# Patient Record
Sex: Female | Born: 1970 | Race: Black or African American | Hispanic: No | Marital: Single | Smoking: Never smoker
Health system: Southern US, Community
[De-identification: ages and names within clinical notes are randomized; demographics above are authoritative.]

## PROBLEM LIST (undated history)

## (undated) HISTORY — PX: ECTOPIC PREGNANCY SURGERY: SHX613

---

## 2007-04-04 ENCOUNTER — Emergency Department: Payer: Self-pay | Admitting: Emergency Medicine

## 2007-08-26 ENCOUNTER — Other Ambulatory Visit: Payer: Self-pay

## 2007-08-26 ENCOUNTER — Inpatient Hospital Stay: Payer: Self-pay | Admitting: Psychiatry

## 2007-12-29 ENCOUNTER — Emergency Department: Payer: Self-pay | Admitting: Emergency Medicine

## 2008-12-15 ENCOUNTER — Emergency Department: Payer: Self-pay | Admitting: Emergency Medicine

## 2012-06-01 ENCOUNTER — Encounter (HOSPITAL_COMMUNITY): Payer: Self-pay | Admitting: *Deleted

## 2012-06-01 ENCOUNTER — Emergency Department (HOSPITAL_COMMUNITY)
Admission: EM | Admit: 2012-06-01 | Discharge: 2012-06-01 | Disposition: A | Discharge status: Home / Self Care | Payer: Self-pay | Attending: Emergency Medicine | Admitting: Emergency Medicine

## 2012-06-01 ENCOUNTER — Emergency Department (HOSPITAL_COMMUNITY): Payer: Self-pay

## 2012-06-01 DIAGNOSIS — N12 Tubulo-interstitial nephritis, not specified as acute or chronic: Secondary | ICD-10-CM | POA: Insufficient documentation

## 2012-06-01 DIAGNOSIS — E669 Obesity, unspecified: Secondary | ICD-10-CM | POA: Insufficient documentation

## 2012-06-01 DIAGNOSIS — N72 Inflammatory disease of cervix uteri: Secondary | ICD-10-CM | POA: Insufficient documentation

## 2012-06-01 DIAGNOSIS — R1032 Left lower quadrant pain: Secondary | ICD-10-CM | POA: Insufficient documentation

## 2012-06-01 LAB — CBC WITH DIFFERENTIAL/PLATELET
Basophils Absolute: 0 10*3/uL (ref 0.0–0.1)
Basophils Relative: 0 % (ref 0–1)
Eosinophils Absolute: 0.1 10*3/uL (ref 0.0–0.7)
Eosinophils Relative: 1 % (ref 0–5)
HCT: 36.1 % (ref 36.0–46.0)
Hemoglobin: 11.9 g/dL — ABNORMAL LOW (ref 12.0–15.0)
Lymphocytes Relative: 26 % (ref 12–46)
Lymphs Abs: 2 10*3/uL (ref 0.7–4.0)
MCH: 28.5 pg (ref 26.0–34.0)
MCHC: 33 g/dL (ref 30.0–36.0)
MCV: 86.4 fL (ref 78.0–100.0)
Monocytes Absolute: 0.3 10*3/uL (ref 0.1–1.0)
Monocytes Relative: 4 % (ref 3–12)
Neutro Abs: 5.4 10*3/uL (ref 1.7–7.7)
Neutrophils Relative %: 69 % (ref 43–77)
Platelets: 326 10*3/uL (ref 150–400)
RBC: 4.18 MIL/uL (ref 3.87–5.11)
RDW: 13.7 % (ref 11.5–15.5)
WBC: 7.8 10*3/uL (ref 4.0–10.5)

## 2012-06-01 LAB — URINALYSIS, ROUTINE W REFLEX MICROSCOPIC
Bilirubin Urine: NEGATIVE
Glucose, UA: NEGATIVE mg/dL
Hgb urine dipstick: NEGATIVE
Ketones, ur: 15 mg/dL — AB
Nitrite: NEGATIVE
Protein, ur: NEGATIVE mg/dL
Specific Gravity, Urine: 1.023 (ref 1.005–1.030)
Urobilinogen, UA: 1 mg/dL (ref 0.0–1.0)
pH: 8 (ref 5.0–8.0)

## 2012-06-01 LAB — BASIC METABOLIC PANEL
BUN: 12 mg/dL (ref 6–23)
CO2: 26 mEq/L (ref 19–32)
Calcium: 9 mg/dL (ref 8.4–10.5)
Chloride: 104 mEq/L (ref 96–112)
Creatinine, Ser: 0.87 mg/dL (ref 0.50–1.10)
GFR calc Af Amer: >90 mL/min (ref >90)
GFR calc non Af Amer: 81 mL/min — ABNORMAL LOW (ref >90)
Glucose, Bld: 132 mg/dL — ABNORMAL HIGH (ref 70–99)
Potassium: 4.4 mEq/L (ref 3.5–5.1)
Sodium: 137 mEq/L (ref 135–145)

## 2012-06-01 LAB — WET PREP, GENITAL
Clue Cells Wet Prep HPF POC: NONE SEEN
Trich, Wet Prep: NONE SEEN
Yeast Wet Prep HPF POC: NONE SEEN

## 2012-06-01 LAB — URINALYSIS, MICROSCOPIC ONLY
Glucose, UA: NEGATIVE mg/dL
pH: 6 (ref 5.0–8.0)

## 2012-06-01 LAB — URINE MICROSCOPIC-ADD ON

## 2012-06-01 LAB — PREGNANCY, URINE: Preg Test, Ur: NEGATIVE

## 2012-06-01 MED ORDER — HYDROMORPHONE HCL PF 1 MG/ML IJ SOLN
1.0000 mg | Freq: Once | INTRAMUSCULAR | Status: AC
  Administered 2012-06-01: 1 mg via INTRAVENOUS
  Filled 2012-06-01: qty 1

## 2012-06-01 MED ORDER — ONDANSETRON HCL 4 MG PO TABS: 4.0000 mg | ORAL_TABLET | Freq: Three times a day (TID) | ORAL | Status: AC | PRN

## 2012-06-01 MED ORDER — ONDANSETRON HCL 4 MG/2ML IJ SOLN
4.0000 mg | INTRAMUSCULAR | Status: AC
  Administered 2012-06-01: 4 mg via INTRAVENOUS
  Filled 2012-06-01: qty 2

## 2012-06-01 MED ORDER — AZITHROMYCIN 250 MG PO TABS
1000.0000 mg | ORAL_TABLET | Freq: Once | ORAL | Status: AC
  Administered 2012-06-01: 1000 mg via ORAL
  Filled 2012-06-01: qty 4

## 2012-06-01 MED ORDER — DEXTROSE 5 % IV SOLN
1.0000 g | INTRAVENOUS | Status: DC
  Administered 2012-06-01: 1 g via INTRAVENOUS
  Filled 2012-06-01: qty 10

## 2012-06-01 MED ORDER — LIDOCAINE HCL (PF) 1 % IJ SOLN
INTRAMUSCULAR | Status: AC
  Administered 2012-06-01: 19:00:00
  Filled 2012-06-01: qty 5

## 2012-06-01 MED ORDER — CEFTRIAXONE SODIUM 250 MG IJ SOLR
125.0000 mg | Freq: Once | INTRAMUSCULAR | Status: AC
  Administered 2012-06-01: 125 mg via INTRAMUSCULAR
  Filled 2012-06-01: qty 250

## 2012-06-01 MED ORDER — CEPHALEXIN 500 MG PO CAPS: 500.0000 mg | ORAL_CAPSULE | Freq: Four times a day (QID) | ORAL | Status: AC

## 2012-06-01 MED ORDER — OXYCODONE-ACETAMINOPHEN 5-325 MG PO TABS: ORAL_TABLET | ORAL | Status: AC

## 2012-06-01 MED ORDER — MORPHINE SULFATE 4 MG/ML IJ SOLN
4.0000 mg | Freq: Once | INTRAMUSCULAR | Status: AC
  Administered 2012-06-01: 4 mg via INTRAVENOUS
  Filled 2012-06-01: qty 1

## 2012-06-01 NOTE — ED Provider Notes (Signed)
History     CSN: [id number]  Arrival date & time 06/01/12  1448   None     Chief Complaint  Patient presents with  . Flank Pain    (Consider location/radiation/quality/duration/timing/severity/associated sxs/prior treatment) The history is provided by the patient and the spouse.    41 year old female who appears uncomfortable complaining of pain to the left lower quadrant with acute onset one hour ago. Pain is 10 out of 10, radiating to the groin, described as sharp. She states it feels similar to a prior episode of kidney stones. Patient vomited 1 time: it was nonbloody and nonbilious. No change in bowel or bladder habits. Patient denies fever. Patient has taken Advil approximately 600 mg 40 minutes ago with little relief. Patient was given 150 mcg of fentanyl and route by EMS pain was relieved for approximately 10 minutes. History of 1 kidney stone 3 years ago. She passed on her own with no intervention.  History reviewed. No pertinent past medical history.  History reviewed. No pertinent past surgical history.  History reviewed. No pertinent family history.  History  Substance Use Topics  . Smoking status: Never Smoker   . Smokeless tobacco: Not on file  . Alcohol Use: No    OB History    Grav Para Term Preterm Abortions TAB SAB Ect Mult Living                  Review of Systems  Constitutional: Negative for fever.  Gastrointestinal: Positive for nausea and vomiting. Negative for diarrhea and constipation.  Genitourinary: Positive for dysuria.  All other systems reviewed and are negative.    Allergies  Review of patient's allergies indicates no known allergies.  Home Medications   Current Outpatient Rx  Name Route Sig Dispense Refill  . IBUPROFEN 200 MG PO TABS Oral Take 600 mg by mouth every 6 (six) hours as needed. For pain      BP 135/82  Pulse 73  Temp 97.9 F (36.6 C) (Oral)  Resp 18  SpO2 96%  Physical Exam  Nursing note and vitals  reviewed. Constitutional: She appears well-developed and well-nourished. No distress.       Patient is obese and appears uncomfortable  HENT:  Head: Normocephalic.  Eyes: Conjunctivae and EOM are normal.  Cardiovascular: Normal rate and regular rhythm.   Pulmonary/Chest: Effort normal and breath sounds normal. No respiratory distress. She has no wheezes. She has no rales. She exhibits no tenderness.  Abdominal: Soft. She exhibits no distension and no mass. There is tenderness. There is no rebound and no guarding.       Tenderness to deep palpation of the left lower quadrant. No peritoneal signs.  Genitourinary: Vagina normal. Pelvic exam was performed with patient prone. There is no rash, tenderness, lesion or injury on the right labia. There is no rash, tenderness, lesion or injury on the left labia. Uterus is not tender. Cervix exhibits discharge. Cervix exhibits no motion tenderness and no friability. Right adnexum displays no mass, no tenderness and no fullness. Left adnexum displays no tenderness and no fullness. No erythema, tenderness or bleeding around the vagina. No foreign body around the vagina. No signs of injury around the vagina. No vaginal discharge found.       No CVA tenderness bilaterally. Tech present throughout pelvic exam. Thin white amine smelling vaginal discharge.   Musculoskeletal: Normal range of motion.  Neurological: She is alert.       Answers questions appropriately.  Skin: No  rash noted.  Psychiatric: She has a normal mood and affect.    ED Course  Procedures (including critical care time)  Labs Reviewed  URINALYSIS, ROUTINE W REFLEX MICROSCOPIC - Abnormal; Notable for the following:    APPearance HAZY (*)     Ketones, ur 15 (*)     Leukocytes, UA MODERATE (*)     All other components within normal limits  CBC WITH DIFFERENTIAL - Abnormal; Notable for the following:    Hemoglobin 11.9 (*)     All other components within normal limits  BASIC METABOLIC  PANEL - Abnormal; Notable for the following:    Glucose, Bld 132 (*)     GFR calc non Af Amer 81 (*)     All other components within normal limits  URINE MICROSCOPIC-ADD ON - Abnormal; Notable for the following:    Squamous Epithelial / LPF MANY (*)     Bacteria, UA FEW (*)     All other components within normal limits  WET PREP, GENITAL - Abnormal; Notable for the following:    WBC, Wet Prep HPF POC RARE (*)     All other components within normal limits  PREGNANCY, URINE  GC/CHLAMYDIA PROBE AMP, GENITAL  URINALYSIS, WITH MICROSCOPIC  URINE CULTURE   Ct Abdomen Pelvis Wo Contrast  06/01/2012  *RADIOLOGY REPORT*  Clinical Data: Left flank pain.  Nausea.  The kidney stone. Hematuria.  CT ABDOMEN AND PELVIS WITHOUT CONTRAST  Technique:  Multidetector CT imaging of the abdomen and pelvis was performed following the standard protocol without intravenous contrast.  Comparison: None.  Findings: Inflammatory changes are present involving the left kidney.  There is mild swelling of the kidney with perinephric stranding.  Mild dilation of the renal pelvis and right ureter however there is no calculus identified within the right ureter or urinary bladder.  This may relate to recent passage of stone or ascending urinary tract infection and pyelonephritis.  Clinically correlate with urinalysis.  There are no collecting system calculi identified.  The right kidney appears normal. Calcified phleboliths are present in the anatomic pelvis, including around the distal ureters bilaterally.  Otherwise the exam is within normal limits.  Physiologic appearance of the uterus and adnexa.  IMPRESSION: Inflammatory changes of the left kidney with ectasia of the right renal pelvis and right ureter.  This may relate to ascending urinary tract infection or recent passage of stone.  No calculi identified.  Original Report Authenticated By: Andreas Newport, M.D.     1. Pyelonephritis   2. Cervicitis       MDM  Although  triage note states his patient presents with left flank pain, she denies flank pain states her issues are in the left lower quadrant. Differential diagnosis kidney stone versus GYN origin. Doubt diverticulitis as bowel movements are normal.  0335 PM: After 4 mg of morphine patient relates pain is still 10 out of 10 I will give 1 mg of Dilaudid. UA in labs still pending  0415PM: After 1 Mg of hydromorphone pain is 7/10. UA shows no blood, contaminated sample. I will perform pelvic  0459PM: Pelvic performed and clinically within normal limits, I will order stone protocol CT to explain severe LLQ pain.   Wet prep shows WBC, I will treat cervicitis with rocephin IM and azithromycin PO.   CT shows perinephric stranding on the left side with no stones, I will obtain clean urine sample for culture and treat presumptively as pyelonephritis.   Discussed case with attending Dr. Ranae Palms  who agrees with care plan and stability to d/c to home.  Pt verbalized understanding and agrees with care plan. Outpatient follow-up and return precautions given.       Wynetta Emery, PA-C 06/01/12 1926

## 2012-06-01 NOTE — ED Notes (Signed)
 of fentanyl and # 20 to left hand per [facility]  PTA

## 2012-06-01 NOTE — ED Notes (Signed)
Left flank pain and wants to rule out kidney stone.

## 2012-06-01 NOTE — Discharge Instructions (Signed)
Return immediately for any worsening pain, uncontrolled, fever vomiting or any change in your bowel habits.   Pyelonephritis, Adult Pyelonephritis is a kidney infection. A kidney infection can happen quickly, or it can last for a long time. HOME CARE   Take your medicine (antibiotics) as told. Finish it even if you start to feel better.   Keep all doctor visits as told.   Drink enough fluids to keep your pee (urine) clear or pale yellow.   Only take medicine as told by your doctor.  GET HELP RIGHT AWAY IF:   You have a fever.   You cannot take your medicine or drink fluids as told.   You have chills and shaking.   You feel very weak or pass out (faint).   You do not feel better after 2 days.  MAKE SURE YOU:  Understand these instructions.   Will watch your condition.   Will get help right away if you are not doing well or get worse.  Document Released: 11/22/2004 Document Revised: 10/04/2011 Document Reviewed: 04/04/2011 [facility] Patient Information 2012 [place], [place].  RESOURCE GUIDE  Chronic Pain Problems: Contact [facility]  [phone] Patients need to be referred by their primary care doctor.  Insufficient Money for Medicine: Contact United Way:  call "211" or [facility] [phone].  No Primary Care Doctor: - Call [facility]  [phone] - can help you locate a primary care doctor that  accepts your insurance, provides certain services, etc. - Physician Referral [phone]  Agencies that provide inexpensive medical care: - [facility]  [phone] - [facility]  [phone] - [facility]  [phone] [facility]  [phone] - Planned Parenthood  [phone] [facility]  [phone]  Medicaid-accepting [facility] Providers: - [facility]- [address]  [phone], Mon-Fri 9am-7pm, Sat 9am-1pm - [facility]- [address] [place], [address] [place]  [phone] - [facility]- [address] [place]  [phone] [facility]- [address]  [phone] - Renaye Rakers- [address] [place], [address], [phone]  Only accepts [place] patients after they have their name  applied to their card  Self Pay (no insurance) in [place]: - Sickle Cell Patients: Dr Willey Blade, [facility]  [address] [place], [phone] - [facility]- [address] [place]  [phone]       [facility] [place]- [address]       -     [facility]- see information above (Speak to [employer] if you do not have insurance)       -  [facility]- [address] [place], [phone]       -  [facility]- [address],  [phone]       -  [facility]- [address] [phone]       -  Dr Julio Sicks-  [address], [address], [place], [phone]       -  [facility]- [address], [phone]       -  [facility]- [address], [phone], also [address], [phone]       -    [facility]- [phone]  [address], [phone], 1st & 3rd Saturday   every month, 10am-1pm  1) Find a Doctor and Pay Out of Pocket Although you won't have to find out who is covered by your insurance plan, it is a good idea to ask around and get recommendations. You will then need to call the office and see if the doctor you have chosen will accept you as a new patient and what types of options they offer for patients who are self-pay. Some doctors offer discounts or will set up payment plans for their patients who do not have insurance, but you will need to ask so you aren't surprised when you get to your appointment.  2) Contact Your Local Health Department Not all health departments have doctors that can see patients for sick visits, but many do, so it is worth a  call to see if yours does. If you don't know where your local health department is, you can check in your phone book. The CDC also has a tool to help you locate your state's health department, and many state websites also have listings of all of their local health departments.  3) Find a Walk-in Clinic If your illness is not likely to be very severe or complicated, you may want to try a walk in clinic. These are popping up all over the country in pharmacies, drugstores, and shopping centers. They're usually staffed by nurse practitioners or physician assistants that have been trained to treat common illnesses and complaints. They're usually fairly quick and inexpensive. However, if you have serious medical issues or chronic medical problems, these are probably not your best option  STD Testing - [facility] Department of [facility] [place], STD Clinic, [address] [place], phone [phone] or [phone].  Monday - Friday, call for an appointment. [facility] Department of [employer], STD Clinic, [address], [place], phone [phone] or [phone].  Monday - Friday, call for an appointment.  Abuse/Neglect: [facility] Child Abuse Hotline (209)757-0100 [facility] Child Abuse Hotline 301-104-7452 (After Hours)  Emergency Shelter:  [facility] 870-012-3262  Maternity Homes: - [facility] [place] [facility] [phone] - [facility] [phone]  MRSA Hotline #:   (418) 761-0339  [facility] Resources  [facility] [place]  United Way [facility] Dept. [address]                 [address]         [address] [place] [address]  Blondell Reveal Phone:  [phone]                                  Phone:  [phone]                   Phone:  [phone]  [facility],  [phone] - [facility] Services - [employer]-  ([phone]       -     [facility] in [place], [address],                                  [phone], [facility] Child Abuse Hotline 416-764-9530 or 980 333 3620 (After Hours)   Behavioral Health Services  Substance Abuse Resources: - [facility] Drug Services  [phone] - [facility] [phone] - [facility] [phone] Floydene Flock 7160489663 - Residential & Outpatient Substance Abuse Program  4695094168  Psychological Services: [facility]  [phone] [employer] [facility]  [phone] - [facility], [phone] [place]. [address] [place], ACCESS LINE: [phone] or 8043513091, [employer]  Dental Assistance  If unable to pay or uninsured, contact:  [facility] or [facility] to become qualified for the adult dental clinic.  Patients with Medicaid: [facility] [phone] [address], [phone] [address], [phone]  If unable to pay, or uninsured, contact [facility] [phone]) or [facility] Department [phone] in [place], [phone] in [facility]) to become qualified for the adult dental clinic  Other Low-Cost Community Dental Services: - [facility]- [address] [place], [place], [place], [address], [phone], Ext. [phone], 2nd and 4th Thursday of the month at 6:30am.  10 clients each day by appointment, can sometimes see walk-in patients if someone does not show for an appointment. [facility]- [address] [place], [place], [address], [phone] - [facility]- [address], [place], [place], [address], [phone] - [employer] [facility]- [phone] [facility] Health Department- [phone] [facility] Department- [phone]

## 2012-06-03 LAB — URINE CULTURE

## 2012-06-04 NOTE — ED Provider Notes (Signed)
Medical screening examination/treatment/procedure(s) were conducted as a shared visit with non-physician practitioner(s) and myself.  I personally evaluated the patient during the encounter   Monroe Qin, MD 06/04/12 2248 

## 2013-06-22 ENCOUNTER — Encounter (HOSPITAL_COMMUNITY): Payer: Self-pay | Admitting: Emergency Medicine

## 2013-06-22 ENCOUNTER — Emergency Department (HOSPITAL_COMMUNITY)
Admission: EM | Admit: 2013-06-22 | Discharge: 2013-06-22 | Disposition: A | Discharge status: Home / Self Care | Payer: Self-pay | Attending: Emergency Medicine | Admitting: Emergency Medicine

## 2013-06-22 ENCOUNTER — Emergency Department (HOSPITAL_COMMUNITY): Payer: Self-pay

## 2013-06-22 DIAGNOSIS — J069 Acute upper respiratory infection, unspecified: Secondary | ICD-10-CM | POA: Insufficient documentation

## 2013-06-22 DIAGNOSIS — J019 Acute sinusitis, unspecified: Secondary | ICD-10-CM | POA: Insufficient documentation

## 2013-06-22 MED ORDER — AZITHROMYCIN 250 MG PO TABS: 250.0000 mg | ORAL_TABLET | Freq: Every day | ORAL | Status: DC

## 2013-06-22 MED ORDER — BENZONATATE 100 MG PO CAPS: 100.0000 mg | ORAL_CAPSULE | Freq: Three times a day (TID) | ORAL | Status: DC

## 2013-06-22 NOTE — ED Notes (Signed)
Pt c/o pain with cough x 3 days; pt sts head congestion; pt sts sore throat and some chills

## 2013-06-22 NOTE — ED Provider Notes (Signed)
CSN: [id number]     Arrival date & time 06/22/13  4098 History     First MD Initiated Contact with Patient 06/22/13 [phone]     Chief Complaint  Patient presents with  . Cough  . URI  . Sore Throat   (Consider location/radiation/quality/duration/timing/severity/associated sxs/prior Treatment) HPI Comments: Patient presents with a chief complaint of cough, nasal congestion, and sinus pressure.  Symptoms have been present for the past 3 days and are gradually worsening.  She has not tried to take anything for her symptoms prior to arrival.  She denies SOB.  She reports that she was felt warm, but has not taken her temperature.  Temperature 98.2 orally upon arrival in the ED.  She does report that her throat feels scratchy and that she has also had some post nasal drip.  She does not smoke and never has.  No history of Asthma.  Patient is a 42 y.o. female presenting with cough, URI, and pharyngitis. The history is provided by the patient.  Cough Associated symptoms: rhinorrhea and sinus congestion   Associated symptoms: no chest pain, no chills, no ear pain, no fever, no rash, no shortness of breath and no wheezing   URI Presenting symptoms: congestion, cough and rhinorrhea   Presenting symptoms: no ear pain and no fever   Associated symptoms: no wheezing   Sore Throat Associated symptoms include congestion and coughing. Pertinent negatives include no chest pain, chills, fever or rash.    History reviewed. No pertinent past medical history. History reviewed. No pertinent past surgical history. History reviewed. No pertinent family history. History  Substance Use Topics  . Smoking status: Never Smoker   . Smokeless tobacco: Not on file  . Alcohol Use: No   OB History   Grav Para Term Preterm Abortions TAB SAB Ect Mult Living                 Review of Systems  Constitutional: Negative for fever and chills.  HENT: Positive for congestion, rhinorrhea, postnasal drip and sinus  pressure. Negative for ear pain.   Respiratory: Positive for cough. Negative for shortness of breath and wheezing.   Cardiovascular: Negative for chest pain.  Skin: Negative for rash.  All other systems reviewed and are negative.    Allergies  Review of patient's allergies indicates no known allergies.  Home Medications   Current Outpatient Rx  Name  Route  Sig  Dispense  Refill  . ibuprofen (ADVIL,MOTRIN) 200 MG tablet   Oral   Take 600 mg by mouth every 6 (six) hours as needed. For pain          BP 112/89  Pulse 81  Temp(Src) 98.2 F (36.8 C) (Oral)  Resp 22  SpO2 95% Physical Exam  Nursing note and vitals reviewed. Constitutional: She appears well-developed and well-nourished. No distress.  HENT:  Head: Normocephalic and atraumatic.  Right Ear: Tympanic membrane and ear canal normal.  Left Ear: Tympanic membrane and ear canal normal.  Nose: Mucosal edema and rhinorrhea present. Right sinus exhibits frontal sinus tenderness. Left sinus exhibits frontal sinus tenderness.  Mouth/Throat: Uvula is midline, oropharynx is clear and moist and mucous membranes are normal.  Cardiovascular: Normal rate, regular rhythm and normal heart sounds.   Pulmonary/Chest: Effort normal and breath sounds normal.  Neurological: She is alert.  Skin: Skin is warm and dry. She is not diaphoretic.  Psychiatric: She has a normal mood and affect.    ED Course   Procedures (including critical  care time)  Labs Reviewed  RAPID STREP SCREEN   Dg Chest 2 View  06/22/2013   *RADIOLOGY REPORT*  Clinical Data: Cough and congestion  CHEST - 2 VIEW  Comparison: None.  Findings: The heart and pulmonary vascularity are within normal limits.  The lungs are clear bilaterally.  No acute bony abnormality is noted.  IMPRESSION: No acute abnormalities seen.   Original Report Authenticated By: Alcide Clever, M.D.   No diagnosis found.  MDM  Pt CXR negative for acute infiltrate. Patients symptoms are  consistent with URI, likely viral etiology. Patient also with symptoms consistent with Acute Sinusitis.  Pt will be discharged with symptomatic treatment.  Verbalizes understanding and is agreeable with plan. Pt is hemodynamically stable & in NAD prior to dc.   Pascal Lux Bowdle, PA-C 06/22/13 1555

## 2013-06-24 LAB — CULTURE, GROUP A STREP

## 2013-06-24 NOTE — ED Provider Notes (Signed)
Medical screening examination/treatment/procedure(s) were performed by non-physician practitioner and as supervising physician I was immediately available for consultation/collaboration.   Laray Anger, DO 06/24/13 2105

## 2014-04-05 ENCOUNTER — Emergency Department: Payer: Self-pay | Admitting: Emergency Medicine

## 2014-08-26 ENCOUNTER — Emergency Department: Payer: Self-pay | Admitting: Student

## 2014-08-26 LAB — CBC
HCT: 35 % (ref 35.0–47.0)
HGB: 11.3 g/dL — AB (ref 12.0–16.0)
MCH: 28.5 pg (ref 26.0–34.0)
MCHC: 32.2 g/dL (ref 32.0–36.0)
MCV: 88 fL (ref 80–100)
PLATELETS: 340 10*3/uL (ref 150–440)
RBC: 3.96 10*6/uL (ref 3.80–5.20)
RDW: 14 % (ref 11.5–14.5)
WBC: 9 10*3/uL (ref 3.6–11.0)

## 2014-08-27 LAB — URINALYSIS, COMPLETE
Bacteria: NONE SEEN
Bilirubin,UR: NEGATIVE
GLUCOSE, UR: NEGATIVE mg/dL (ref 0–75)
Ketone: NEGATIVE
Leukocyte Esterase: NEGATIVE
Nitrite: NEGATIVE
Ph: 5 (ref 4.5–8.0)
Protein: NEGATIVE
SPECIFIC GRAVITY: 1.028 (ref 1.003–1.030)
Squamous Epithelial: <1

## 2014-08-27 LAB — WET PREP, GENITAL

## 2014-08-27 LAB — GC/CHLAMYDIA PROBE AMP

## 2014-12-16 ENCOUNTER — Emergency Department: Payer: Self-pay | Admitting: Emergency Medicine

## 2014-12-17 ENCOUNTER — Emergency Department: Payer: Self-pay | Admitting: Emergency Medicine

## 2014-12-20 ENCOUNTER — Emergency Department: Payer: Self-pay | Admitting: Emergency Medicine

## 2017-01-23 ENCOUNTER — Encounter: Payer: Self-pay | Admitting: Emergency Medicine

## 2017-01-23 DIAGNOSIS — B9689 Other specified bacterial agents as the cause of diseases classified elsewhere: Secondary | ICD-10-CM | POA: Insufficient documentation

## 2017-01-23 DIAGNOSIS — N760 Acute vaginitis: Secondary | ICD-10-CM | POA: Insufficient documentation

## 2017-01-23 DIAGNOSIS — M79604 Pain in right leg: Secondary | ICD-10-CM | POA: Insufficient documentation

## 2017-01-23 NOTE — ED Triage Notes (Signed)
Patient ambulatory to triage with steady gait, without difficulty or distress noted; pt reports pain to right LE with swelling; denies any known injury; also c/o intermittent pain to right side x week

## 2017-01-24 ENCOUNTER — Emergency Department: Payer: Self-pay

## 2017-01-24 ENCOUNTER — Emergency Department
Admission: EM | Admit: 2017-01-24 | Discharge: 2017-01-24 | Disposition: A | Discharge status: Home / Self Care | Payer: Self-pay | Attending: Emergency Medicine | Admitting: Emergency Medicine

## 2017-01-24 DIAGNOSIS — B9689 Other specified bacterial agents as the cause of diseases classified elsewhere: Secondary | ICD-10-CM

## 2017-01-24 DIAGNOSIS — N760 Acute vaginitis: Secondary | ICD-10-CM

## 2017-01-24 DIAGNOSIS — R52 Pain, unspecified: Secondary | ICD-10-CM

## 2017-01-24 DIAGNOSIS — R102 Pelvic and perineal pain: Secondary | ICD-10-CM

## 2017-01-24 DIAGNOSIS — M79604 Pain in right leg: Secondary | ICD-10-CM

## 2017-01-24 LAB — URINALYSIS, COMPLETE (UACMP) WITH MICROSCOPIC
BACTERIA UA: NONE SEEN
Bilirubin Urine: NEGATIVE
Glucose, UA: NEGATIVE mg/dL
Ketones, ur: NEGATIVE mg/dL
Leukocytes, UA: NEGATIVE
NITRITE: NEGATIVE
Protein, ur: NEGATIVE mg/dL
Specific Gravity, Urine: 1.026 (ref 1.005–1.030)
pH: 5 (ref 5.0–8.0)

## 2017-01-24 LAB — COMPREHENSIVE METABOLIC PANEL
ALBUMIN: 3.9 g/dL (ref 3.5–5.0)
ALK PHOS: 74 U/L (ref 38–126)
ALT: 15 U/L (ref 14–54)
AST: 23 U/L (ref 15–41)
Anion gap: 6 (ref 5–15)
BUN: 13 mg/dL (ref 6–20)
CALCIUM: 8.9 mg/dL (ref 8.9–10.3)
CHLORIDE: 105 mmol/L (ref 101–111)
CO2: 27 mmol/L (ref 22–32)
CREATININE: 0.75 mg/dL (ref 0.44–1.00)
GFR calc non Af Amer: >60 mL/min (ref >60)
GLUCOSE: 110 mg/dL — AB (ref 65–99)
Potassium: 3.7 mmol/L (ref 3.5–5.1)
SODIUM: 138 mmol/L (ref 135–145)
Total Bilirubin: 0.4 mg/dL (ref 0.3–1.2)
Total Protein: 8 g/dL (ref 6.5–8.1)

## 2017-01-24 LAB — CBC
HCT: 35.2 % (ref 35.0–47.0)
HEMOGLOBIN: 11.8 g/dL — AB (ref 12.0–16.0)
MCH: 28.5 pg (ref 26.0–34.0)
MCHC: 33.6 g/dL (ref 32.0–36.0)
MCV: 84.8 fL (ref 80.0–100.0)
PLATELETS: 334 10*3/uL (ref 150–440)
RBC: 4.15 MIL/uL (ref 3.80–5.20)
RDW: 14.5 % (ref 11.5–14.5)
WBC: 10.4 10*3/uL (ref 3.6–11.0)

## 2017-01-24 LAB — CHLAMYDIA/NGC RT PCR (ARMC ONLY)
CHLAMYDIA TR: NOT DETECTED
N gonorrhoeae: NOT DETECTED

## 2017-01-24 LAB — WET PREP, GENITAL
Sperm: NONE SEEN
TRICH WET PREP: NONE SEEN
YEAST WET PREP: NONE SEEN

## 2017-01-24 LAB — POCT PREGNANCY, URINE: Preg Test, Ur: NEGATIVE

## 2017-01-24 MED ORDER — METRONIDAZOLE 500 MG PO TABS: 500.0000 mg | ORAL_TABLET | Freq: Two times a day (BID) | ORAL | 0 refills | Status: DC

## 2017-01-24 NOTE — ED Provider Notes (Signed)
[facility] Emergency Department Provider Note   ____________________________________________   First MD Initiated Contact with Patient 01/24/17 0405     (approximate)  I have reviewed the triage vital signs and the nursing notes.   HISTORY  Chief Complaint Abdominal Pain and Leg Pain    HPI Heidi MeekerShawanna Acevedo is a 46 y.o. female who presents to the ED from home with a chief complaint of abdominal pain and leg pain. Patient reports right pelvic pain intermittently for the past month. Complains of right lateral leg pain with swelling times one day. Denies associated fever, chills, chest pain, shortness of breath, nausea, vomiting, dysuria, diarrhea. Denies recent travel or trauma. Nothing makes her symptoms better. Patient stands [facility] feet [facility] [employer] which makes her leg pain worse.   Past medical history None  There are no active problems to display for this patient.   History reviewed. No pertinent surgical history.  Prior to Admission medications   Medication Sig Start Date End Date Taking? Authorizing Provider  azithromycin (ZITHROMAX) 250 MG tablet Take 1 tablet (250 mg total) by mouth daily. Take first 2 tablets together, then 1 every day until finished. 06/22/13   Heather Laisure, PA-C  benzonatate (TESSALON) 100 MG capsule Take 1 capsule (100 mg total) by mouth every 8 (eight) hours. 06/22/13   Heather Laisure, PA-C  ibuprofen (ADVIL,MOTRIN) 200 MG tablet Take 600 mg by mouth every 6 (six) hours as needed. For pain    Historical Provider, MD    Allergies Patient has no known allergies.  No family history on file.  Social History Social History  Substance Use Topics  . Smoking status: Never Smoker  . Smokeless tobacco: Never Used  . Alcohol use No    Review of Systems  Constitutional: No fever/chills. Eyes: No visual changes. ENT: No sore throat. Cardiovascular: Denies chest pain. Respiratory: Denies shortness of  breath. Gastrointestinal: Positive for abdominal pain.  No nausea, no vomiting.  No diarrhea.  No constipation. Genitourinary: Negative for dysuria. Musculoskeletal: Positive for right leg pain. Negative for back pain. Skin: Negative for rash. Neurological: Negative for headaches, focal weakness or numbness.  10-point ROS otherwise negative.  ____________________________________________   PHYSICAL EXAM:  VITAL SIGNS: ED Triage Vitals [01/23/17 2356]  Enc Vitals Group     BP 126/85     Pulse Rate 81     Resp 18     Temp 98.2 F (36.8 C)     Temp Source Oral     SpO2 99 %     Weight 260 lb (117.9 kg)     Height 5\' 9"  (1.753 m)     Head Circumference      Peak Flow      Pain Score 8     Pain Loc      Pain Edu?      Excl. in GC?     Constitutional: Alert and oriented. Well appearing and in no acute distress. Eyes: Conjunctivae are normal. PERRL. EOMI. Head: Atraumatic. Nose: No congestion/rhinnorhea. Mouth/Throat: Mucous membranes are moist.  Oropharynx non-erythematous. Neck: No stridor.   Cardiovascular: Normal rate, regular rhythm. Grossly normal heart sounds.  Good peripheral circulation. Respiratory: Normal respiratory effort.  No retractions. Lungs CTAB. Gastrointestinal: Obese. Soft and mildly tender to palpation right pelvis without rebound or guarding. No distention. No abdominal bruits. No CVA tenderness. Musculoskeletal: Right lateral leg mildly tender to palpation. Calf is supple without evidence for compartment syndrome. 2+ distal pulses. Brisk, less than  5 second capillary refill. Symmetrically warm limbs without evidence for ischemia. 1+ pedal edema.  No joint effusions. Neurologic:  Normal speech and language. No gross focal neurologic deficits are appreciated.  Skin:  Skin is warm, dry and intact. No rash noted. Psychiatric: Mood and affect are normal. Speech and behavior are normal.  ____________________________________________   LABS (all labs ordered  are listed, but only abnormal results are displayed)  Labs Reviewed  WET PREP, GENITAL - Abnormal; Notable for the following:       Result Value   Clue Cells Wet Prep HPF POC PRESENT (*)    WBC, Wet Prep HPF POC FEW (*)    All other components within normal limits  CBC - Abnormal; Notable for the following:    Hemoglobin 11.8 (*)    All other components within normal limits  COMPREHENSIVE METABOLIC PANEL - Abnormal; Notable for the following:    Glucose, Bld 110 (*)    All other components within normal limits  URINALYSIS, COMPLETE (UACMP) WITH MICROSCOPIC - Abnormal; Notable for the following:    Color, Urine YELLOW (*)    APPearance HAZY (*)    Hgb urine dipstick SMALL (*)    Squamous Epithelial / LPF 6-30 (*)    All other components within normal limits  CHLAMYDIA/NGC RT PCR ([facility] ONLY)  POCT PREGNANCY, URINE   ____________________________________________  EKG  None ____________________________________________  RADIOLOGY  Right leg Doppler interpreted per Dr. Harrie Jeans: No evidence of deep venous thrombosis.  US pelvis interpreted per Dr. Manus Gunning: 1. Normal sonographic appearance of the ovaries with normal blood  flow. No torsion.  2. Minimal heterogeneity of myometrial echotexture similar to prior  exam, may be normal for this patient. Previous endometrial hyperemia  has resolved.    ____________________________________________   PROCEDURES  Procedure(s) performed:   Pelvic exam: External exam WNL without rashes, lesions or vesicles. Speculum exam reveals mild white discharge. Bimanual exam with mild right adnexal tenderness.  Procedures  Critical Care performed: No  ____________________________________________   INITIAL IMPRESSION / ASSESSMENT AND PLAN / ED COURSE  Pertinent labs & imaging results that were available during my care of the patient were reviewed by me and considered in my medical decision making (see chart for  details).  46 year old female who presents with a one-month history of right sided abdominal pain. Low suspicion clinically for acute appendicitis given duration of symptoms, no fever and normal white count. Will obtain pelvic ultrasound. Right leg pain most likely musculoskeletal as ultrasound is negative for DVT.  Clinical Course as of Jan 25 640  Thu Jan 24, 2017  1610 She resting in no acute distress. Updated her of wet prep and imaging results. Discharged home with a prescription for Flagyl and patient will follow-up with her GYN next week. Strict return precautions given. Patient verbalizes understanding and agrees with plan of care.  [JS]    Clinical Course User Index [JS] Irean Hong, MD     ____________________________________________   FINAL CLINICAL IMPRESSION(S) / ED DIAGNOSES  Final diagnoses:  Pain  Right leg pain  Pelvic pain  Bacterial vaginosis      NEW MEDICATIONS STARTED DURING THIS VISIT:  New Prescriptions   No medications on file     Note:  This document was prepared using Dragon voice recognition software and may include unintentional dictation errors.    Irean Hong, MD 01/24/17 [phone]

## 2017-01-24 NOTE — ED Notes (Signed)
Patient transported to Ultrasound at this time. 

## 2017-01-24 NOTE — Discharge Instructions (Signed)
1. Take antibiotic as prescribed (Flagyl 500 mg twice daily ×7 days). °2. Return to the ER for worsening symptoms, persistent vomiting, difficulty breathing or other concerns. °

## 2017-08-25 ENCOUNTER — Encounter: Payer: Self-pay | Admitting: *Deleted

## 2017-08-25 ENCOUNTER — Encounter: Payer: Self-pay | Admitting: Emergency Medicine

## 2017-08-25 ENCOUNTER — Emergency Department
Admission: EM | Admit: 2017-08-25 | Discharge: 2017-08-25 | Disposition: A | Discharge status: Home / Self Care | Payer: BLUE CROSS/BLUE SHIELD | Attending: Emergency Medicine | Admitting: Emergency Medicine

## 2017-08-25 DIAGNOSIS — R1033 Periumbilical pain: Secondary | ICD-10-CM | POA: Insufficient documentation

## 2017-08-25 DIAGNOSIS — Z79899 Other long term (current) drug therapy: Secondary | ICD-10-CM | POA: Diagnosis not present

## 2017-08-25 DIAGNOSIS — R350 Frequency of micturition: Secondary | ICD-10-CM | POA: Insufficient documentation

## 2017-08-25 DIAGNOSIS — R110 Nausea: Secondary | ICD-10-CM | POA: Insufficient documentation

## 2017-08-25 DIAGNOSIS — Z5321 Procedure and treatment not carried out due to patient leaving prior to being seen by health care provider: Secondary | ICD-10-CM | POA: Insufficient documentation

## 2017-08-25 LAB — CBC
HCT: 38.3 % (ref 35.0–47.0)
HEMATOCRIT: 36.2 % (ref 35.0–47.0)
HEMOGLOBIN: 12.4 g/dL (ref 12.0–16.0)
Hemoglobin: 12.5 g/dL (ref 12.0–16.0)
MCH: 28.3 pg (ref 26.0–34.0)
MCH: 29.4 pg (ref 26.0–34.0)
MCHC: 32.6 g/dL (ref 32.0–36.0)
MCHC: 34.1 g/dL (ref 32.0–36.0)
MCV: 86.3 fL (ref 80.0–100.0)
MCV: 86.6 fL (ref 80.0–100.0)
PLATELETS: 429 10*3/uL (ref 150–440)
Platelets: 404 10*3/uL (ref 150–440)
RBC: 4.2 MIL/uL (ref 3.80–5.20)
RBC: 4.43 MIL/uL (ref 3.80–5.20)
RDW: 14.3 % (ref 11.5–14.5)
RDW: 14.4 % (ref 11.5–14.5)
WBC: 7.4 10*3/uL (ref 3.6–11.0)
WBC: 8.2 10*3/uL (ref 3.6–11.0)

## 2017-08-25 LAB — URINALYSIS, COMPLETE (UACMP) WITH MICROSCOPIC
Bacteria, UA: NONE SEEN
Bilirubin Urine: NEGATIVE
Bilirubin Urine: NEGATIVE
GLUCOSE, UA: NEGATIVE mg/dL
Glucose, UA: NEGATIVE mg/dL
Hgb urine dipstick: NEGATIVE
KETONES UR: NEGATIVE mg/dL
KETONES UR: NEGATIVE mg/dL
NITRITE: NEGATIVE
Nitrite: NEGATIVE
PH: 6 (ref 5.0–8.0)
PROTEIN: NEGATIVE mg/dL
PROTEIN: NEGATIVE mg/dL
Specific Gravity, Urine: 1.026 (ref 1.005–1.030)
Specific Gravity, Urine: 1.026 (ref 1.005–1.030)
pH: 5 (ref 5.0–8.0)

## 2017-08-25 LAB — COMPREHENSIVE METABOLIC PANEL
ALT: 19 U/L (ref 14–54)
ANION GAP: 9 (ref 5–15)
AST: 25 U/L (ref 15–41)
Albumin: 3.8 g/dL (ref 3.5–5.0)
Alkaline Phosphatase: 72 U/L (ref 38–126)
BUN: 12 mg/dL (ref 6–20)
CHLORIDE: 103 mmol/L (ref 101–111)
CO2: 26 mmol/L (ref 22–32)
Calcium: 8.9 mg/dL (ref 8.9–10.3)
Creatinine, Ser: 0.75 mg/dL (ref 0.44–1.00)
GFR calc Af Amer: >60 mL/min (ref >60)
GFR calc non Af Amer: >60 mL/min (ref >60)
GLUCOSE: 135 mg/dL — AB (ref 65–99)
POTASSIUM: 4.1 mmol/L (ref 3.5–5.1)
SODIUM: 138 mmol/L (ref 135–145)
TOTAL PROTEIN: 7.8 g/dL (ref 6.5–8.1)
Total Bilirubin: 0.7 mg/dL (ref 0.3–1.2)

## 2017-08-25 LAB — POCT PREGNANCY, URINE
Preg Test, Ur: NEGATIVE
Preg Test, Ur: NEGATIVE

## 2017-08-25 LAB — LIPASE, BLOOD: LIPASE: 20 U/L (ref 11–51)

## 2017-08-25 NOTE — ED Triage Notes (Signed)
Patient with complaint of central abdominal pain and nausea that started last night. Patient states that she was here for the same last night and left before being seen.

## 2017-08-25 NOTE — ED Triage Notes (Signed)
Pt to ED reporting lower abd pain and tenderness around belly button with significant tenderness upon palpation. Pt reports nausea but denies vomiting or diarrhea. Pt denies fevers or decreased appetite. PT reports having increased urinary frequency but denies dysuria.

## 2017-08-26 ENCOUNTER — Encounter: Payer: Self-pay | Admitting: Radiology

## 2017-08-26 ENCOUNTER — Emergency Department
Admission: EM | Admit: 2017-08-26 | Discharge: 2017-08-26 | Disposition: A | Discharge status: Home / Self Care | Payer: BLUE CROSS/BLUE SHIELD | Attending: Emergency Medicine | Admitting: Emergency Medicine

## 2017-08-26 ENCOUNTER — Emergency Department: Payer: BLUE CROSS/BLUE SHIELD

## 2017-08-26 DIAGNOSIS — R109 Unspecified abdominal pain: Secondary | ICD-10-CM

## 2017-08-26 DIAGNOSIS — R1033 Periumbilical pain: Secondary | ICD-10-CM

## 2017-08-26 LAB — COMPREHENSIVE METABOLIC PANEL
ALT: 21 U/L (ref 14–54)
ANION GAP: 9 (ref 5–15)
AST: 26 U/L (ref 15–41)
Albumin: 4.2 g/dL (ref 3.5–5.0)
Alkaline Phosphatase: 73 U/L (ref 38–126)
BILIRUBIN TOTAL: 0.6 mg/dL (ref 0.3–1.2)
BUN: 14 mg/dL (ref 6–20)
CO2: 26 mmol/L (ref 22–32)
Calcium: 9.6 mg/dL (ref 8.9–10.3)
Chloride: 103 mmol/L (ref 101–111)
Creatinine, Ser: 0.91 mg/dL (ref 0.44–1.00)
GFR calc Af Amer: >60 mL/min (ref >60)
Glucose, Bld: 104 mg/dL — ABNORMAL HIGH (ref 65–99)
POTASSIUM: 3.8 mmol/L (ref 3.5–5.1)
Sodium: 138 mmol/L (ref 135–145)
Total Protein: 8.4 g/dL — ABNORMAL HIGH (ref 6.5–8.1)

## 2017-08-26 LAB — LIPASE, BLOOD: Lipase: 20 U/L (ref 11–51)

## 2017-08-26 MED ORDER — ETODOLAC 200 MG PO CAPS: 200.0000 mg | ORAL_CAPSULE | Freq: Three times a day (TID) | ORAL | 0 refills | Status: DC

## 2017-08-26 MED ORDER — KETOROLAC TROMETHAMINE 30 MG/ML IJ SOLN
30.0000 mg | Freq: Once | INTRAMUSCULAR | Status: AC
  Administered 2017-08-26: 30 mg via INTRAVENOUS
  Filled 2017-08-26: qty 1

## 2017-08-26 MED ORDER — IOPAMIDOL (ISOVUE-300) INJECTION 61%
125.0000 mL | Freq: Once | INTRAVENOUS | Status: AC | PRN
  Administered 2017-08-26: 125 mL via INTRAVENOUS

## 2017-08-26 NOTE — ED Notes (Signed)
Patient to stat desk asking about wait time. Patient given update on wait time. Patient states that if she is not seen by 2:00 she will leave. Patient in no acute distress at this time.

## 2017-08-26 NOTE — ED Provider Notes (Signed)
[facility] Emergency Department Provider Note   ____________________________________________   First MD Initiated Contact with Patient 08/26/17 0400     (approximate)  I have reviewed the triage vital signs and the nursing notes.   HISTORY  Chief Complaint Abdominal Pain    HPI Heidi Acevedo is a 46 y.o. female Who comes into the hospital today with pain coming from her belly button. She reports it is right in her belly button and it doesn't move anywhere else. The pain started last night. She reports that she had been doing some heavy lifting at work and not taking anything for the pain. The patient states her pain is 8 out of 10 in intensity. She's had some intermittent nausea with no vomiting diarrhea or constipation. She's never had pain this in the past. The patient states that she has no chest pain or shortness of breath. The patient was here last night but left prior to being seen. She is here for further evaluation of her umbilical pain.   History reviewed. No pertinent past medical history.  There are no active problems to display for this patient.   History reviewed. No pertinent surgical history.  Prior to Admission medications   Medication Sig Start Date End Date Taking? Authorizing Provider  azithromycin (ZITHROMAX) 250 MG tablet Take 1 tablet (250 mg total) by mouth daily. Take first 2 tablets together, then 1 every day until finished. 06/22/13   Santiago Glad, PA-C  benzonatate (TESSALON) 100 MG capsule Take 1 capsule (100 mg total) by mouth every 8 (eight) hours. 06/22/13   Santiago Glad, PA-C  etodolac (LODINE) 200 MG capsule Take 1 capsule (200 mg total) by mouth every 8 (eight) hours. 08/26/17   Rebecka Apley, MD  ibuprofen (ADVIL,MOTRIN) 200 MG tablet Take 600 mg by mouth every 6 (six) hours as needed. For pain    [provider]  metroNIDAZOLE (FLAGYL) 500 MG tablet Take 1 tablet (500 mg total) by mouth 2 (two)  times daily. 01/24/17   Irean Hong, MD    Allergies Patient has no known allergies.  No family history on file.  Social History Social History  Substance Use Topics  . Smoking status: Never Smoker  . Smokeless tobacco: Never Used  . Alcohol use No    Review of Systems  Constitutional: No fever/chills Eyes: No visual changes. ENT: No sore throat. Cardiovascular: Denies chest pain. Respiratory: Denies shortness of breath. Gastrointestinal: umbilical pain and  nausea, no vomiting.  No diarrhea.  No constipation. Genitourinary: Negative for dysuria. Musculoskeletal: Negative for back pain. Skin: Negative for rash. Neurological: Negative for headaches, focal weakness or numbness.  ____________________________________________   PHYSICAL EXAM:  VITAL SIGNS: ED Triage Vitals  Enc Vitals Group     BP 08/25/17 2321 127/81     Pulse Rate 08/25/17 2321 81     Resp 08/25/17 2321 18     Temp 08/25/17 2321 97.9 F (36.6 C)     Temp Source 08/25/17 2321 Oral     SpO2 08/25/17 2321 96 %     Weight 08/25/17 2321 250 lb (113.4 kg)     Height 08/25/17 2321 5\' 9"  (1.753 m)     Head Circumference --      Peak Flow --      Pain Score 08/25/17 2320 8     Pain Loc --      Pain Edu? --      Excl. in GC? --  Constitutional: Alert and oriented. Well appearing and in mild distress. Eyes: Conjunctivae are normal. PERRL. EOMI. Head: Atraumatic. Nose: No congestion/rhinnorhea. Mouth/Throat: Mucous membranes are moist.  Oropharynx non-erythematous. Cardiovascular: Normal rate, regular rhythm. Grossly normal heart sounds.  Good peripheral circulation. Respiratory: Normal respiratory effort.  No retractions. Lungs CTAB. Gastrointestinal: Soft with some tenderness in the umbilicus without redness, swelling or warmth to the area. No distention. positive bowel sounds Musculoskeletal: No lower extremity tenderness nor edema.   Neurologic:  Normal speech and language. No gross focal  neurologic deficits are appreciated. No gait instability. Skin:  Skin is warm, dry and intact.  Psychiatric: Mood and affect are normal.   ____________________________________________   LABS (all labs ordered are listed, but only abnormal results are displayed)  Labs Reviewed  COMPREHENSIVE METABOLIC PANEL - Abnormal; Notable for the following:       Result Value   Glucose, Bld 104 (*)    Total Protein 8.4 (*)    All other components within normal limits  URINALYSIS, COMPLETE (UACMP) WITH MICROSCOPIC - Abnormal; Notable for the following:    Color, Urine YELLOW (*)    APPearance HAZY (*)    Hgb urine dipstick SMALL (*)    Leukocytes, UA TRACE (*)    Squamous Epithelial / LPF 6-30 (*)    All other components within normal limits  LIPASE, BLOOD  CBC  POC URINE PREG, ED  POCT PREGNANCY, URINE   ____________________________________________  EKG  none ____________________________________________  RADIOLOGY  Ct Abdomen Pelvis W Contrast  Result Date: 08/26/2017 CLINICAL DATA:  Central abdominal pain, nausea for 2 days. EXAM: CT ABDOMEN AND PELVIS WITH CONTRAST TECHNIQUE: Multidetector CT imaging of the abdomen and pelvis was performed using the standard protocol following bolus administration of intravenous contrast. CONTRAST:  ISOVUE-300 IOPAMIDOL (ISOVUE-300) INJECTION 61% COMPARISON:  Pelvic ultrasound January 24, 2017 and CT abdomen and pelvis June 01, 2012 FINDINGS: LOWER CHEST: 5 mm ground-glass nodule lingula, no routine indicated follow-up. Included heart size is normal. No pericardial effusion. HEPATOBILIARY: Liver and gallbladder are normal. PANCREAS: Normal. SPLEEN: Normal. ADRENALS/URINARY TRACT: Kidneys are orthotopic, demonstrating symmetric enhancement. Punctate LEFT nephrolithiasis. No hydronephrosis or solid renal masses. The unopacified ureters are normal in course and caliber. Delayed imaging through the kidneys demonstrates symmetric prompt contrast excretion  within the proximal urinary collecting system. Urinary bladder is partially distended and unremarkable. Normal adrenal glands. STOMACH/BOWEL: Tiny hiatal hernia. The stomach, small and large bowel are normal in course and caliber without inflammatory changes, sensitivity decreased without oral contrast. Normal appendix. VASCULAR/LYMPHATIC: Aortoiliac vessels are normal in course and caliber. No lymphadenopathy by CT size criteria. Phleboliths in the pelvis. REPRODUCTIVE: Normal. OTHER: No intraperitoneal free fluid or free air. MUSCULOSKELETAL: Nonacute. Mild rectus abdominus diastases. Moderate sacroiliac osteoarthrosis. Moderate L5-S1 degenerative disc. Mild lower lumbar facet arthropathy. IMPRESSION: 1. No acute intra-abdominal or pelvic process. 2. Punctate nonobstructing LEFT nephrolithiasis. Electronically Signed   By: Awilda Metro M.D.   On: 08/26/2017 05:49    ____________________________________________   PROCEDURES  Procedure(s) performed: None  Procedures  Critical Care performed: No  ____________________________________________   INITIAL IMPRESSION / ASSESSMENT AND PLAN / ED COURSE  As part of my medical decision making, I reviewed the following data within the electronic MEDICAL RECORD NUMBER Notes from prior ED visits and [place] Controlled Substance Database   This is a 46 year old who comes into the hospital today with some pain around her belly button. The patient had some blood work which was unremarkable but given her  pain I did send the patient for a CT scan.  My differential diagnosis includes colitis, umbilical hernia, inflammation.  The patient's CT scan returned and it showed no acute intra-abdominal or pelvic process. The patient has some nonobstructing left nephrolithiasis but otherwise no other cause of her pain. I will give the patient a shot of Toradol and discharged home. Her white count is not elevated to do not feel that she needs any antibiotics for tonsillitis.  The patient to follow-up with the acute care clinic for further evaluation.      ____________________________________________   FINAL CLINICAL IMPRESSION(S) / ED DIAGNOSES  Final diagnoses:  Abdominal pain, unspecified abdominal location  Umbilical pain      NEW MEDICATIONS STARTED DURING THIS VISIT:  New Prescriptions   ETODOLAC (LODINE) 200 MG CAPSULE    Take 1 capsule (200 mg total) by mouth every 8 (eight) hours.     Note:  This document was prepared using Dragon voice recognition software and may include unintentional dictation errors.    Rebecka ApleyWebster, Allison P, MD 08/26/17 [phone]

## 2017-08-26 NOTE — Discharge Instructions (Signed)
Please follow up with your primary care physician. Please return with any redness, swelling, warmth or fever.

## 2018-01-10 ENCOUNTER — Emergency Department
Admission: EM | Admit: 2018-01-10 | Discharge: 2018-01-10 | Disposition: A | Discharge status: Home / Self Care | Payer: BLUE CROSS/BLUE SHIELD | Attending: Emergency Medicine | Admitting: Emergency Medicine

## 2018-01-10 ENCOUNTER — Other Ambulatory Visit: Payer: Self-pay

## 2018-01-10 ENCOUNTER — Encounter: Payer: Self-pay | Admitting: Emergency Medicine

## 2018-01-10 DIAGNOSIS — J111 Influenza due to unidentified influenza virus with other respiratory manifestations: Secondary | ICD-10-CM | POA: Diagnosis not present

## 2018-01-10 DIAGNOSIS — R69 Illness, unspecified: Secondary | ICD-10-CM

## 2018-01-10 DIAGNOSIS — Z79899 Other long term (current) drug therapy: Secondary | ICD-10-CM | POA: Diagnosis not present

## 2018-01-10 DIAGNOSIS — M7918 Myalgia, other site: Secondary | ICD-10-CM | POA: Diagnosis present

## 2018-01-10 LAB — INFLUENZA PANEL BY PCR (TYPE A & B)
Influenza A By PCR: NEGATIVE
Influenza B By PCR: NEGATIVE

## 2018-01-10 MED ORDER — PROMETHAZINE-DM 6.25-15 MG/5ML PO SYRP: 5.0000 mL | ORAL_SOLUTION | Freq: Four times a day (QID) | ORAL | 0 refills | Status: DC | PRN

## 2018-01-10 MED ORDER — IBUPROFEN 800 MG PO TABS: 800.0000 mg | ORAL_TABLET | Freq: Three times a day (TID) | ORAL | 0 refills | Status: DC | PRN

## 2018-01-10 MED ORDER — LIDOCAINE VISCOUS 2 % MT SOLN: 5.0000 mL | Freq: Four times a day (QID) | OROMUCOSAL | 0 refills | Status: DC | PRN

## 2018-01-10 NOTE — ED Triage Notes (Addendum)
Patient ambulatory to triage with steady gait, without difficulty or distress noted; mask placed; pt reports awoke this am with generalized body aches and drainage; denies sore throat but st "feels like I need to clear my throat"; denies any other accomp symptoms

## 2018-01-10 NOTE — ED Provider Notes (Signed)
[facility] Emergency Department Provider Note   ____________________________________________   First MD Initiated Contact with Patient 01/10/18 0730     (approximate)  I have reviewed the triage vital signs and the nursing notes.   HISTORY  Chief Complaint Generalized Body Aches    HPI Heidi Acevedo is a 47 y.o. female patient complain of waking up with generalized body aches.  Patient denies nasal congestion complain of postnasal drainage.  Patient denies nausea, vomiting, diarrhea.  Patient had taken flu shot for this season.  No palliative measures for complaint.  . History reviewed. No pertinent past medical history.  There are no active problems to display for this patient.   History reviewed. No pertinent surgical history.  Prior to Admission medications   Medication Sig Start Date End Date Taking? Authorizing Provider  azithromycin (ZITHROMAX) 250 MG tablet Take 1 tablet (250 mg total) by mouth daily. Take first 2 tablets together, then 1 every day until finished. 06/22/13   Santiago Glad, PA-C  benzonatate (TESSALON) 100 MG capsule Take 1 capsule (100 mg total) by mouth every 8 (eight) hours. 06/22/13   Santiago Glad, PA-C  etodolac (LODINE) 200 MG capsule Take 1 capsule (200 mg total) by mouth every 8 (eight) hours. 08/26/17   Rebecka Apley, MD  ibuprofen (ADVIL,MOTRIN) 200 MG tablet Take 600 mg by mouth every 6 (six) hours as needed. For pain    [provider]  ibuprofen (ADVIL,MOTRIN) 800 MG tablet Take 1 tablet (800 mg total) by mouth every 8 (eight) hours as needed for moderate pain. 01/10/18   Joni Reining, PA-C  lidocaine (XYLOCAINE) 2 % solution Use as directed 5 mLs in the mouth or throat every 6 (six) hours as needed for mouth pain. Mix with Phenergan DM for swish and swallow. 01/10/18   Joni Reining, PA-C  metroNIDAZOLE (FLAGYL) 500 MG tablet Take 1 tablet (500 mg total) by mouth 2 (two) times daily. 01/24/17    Irean Hong, MD  promethazine-dextromethorphan (PROMETHAZINE-DM) 6.25-15 MG/5ML syrup Take 5 mLs by mouth 4 (four) times daily as needed for cough. Mix with viscous lidocaine for swish and swallow. 01/10/18   Joni Reining, PA-C    Allergies Patient has no known allergies.  No family history on file.  Social History Social History   Tobacco Use  . Smoking status: Never Smoker  . Smokeless tobacco: Never Used  Substance Use Topics  . Alcohol use: No  . Drug use: No    Review of Systems Constitutional: No fever/chills.  Body aches Eyes: No visual changes. ENT: No sore throat. Cardiovascular: Denies chest pain. Respiratory: Denies shortness of breath. Gastrointestinal: No abdominal pain.  No nausea, no vomiting.  No diarrhea.  No constipation. Genitourinary: Negative for dysuria. Musculoskeletal: Negative for back pain. Skin: Negative for rash. Neurological: Negative for headaches, focal weakness or numbness.   ____________________________________________   PHYSICAL EXAM:  VITAL SIGNS: ED Triage Vitals  Enc Vitals Group     BP 01/10/18 0614 135/78     Pulse Rate 01/10/18 0614 80     Resp 01/10/18 0614 20     Temp 01/10/18 0614 98.3 F (36.8 C)     Temp Source 01/10/18 0614 Oral     SpO2 01/10/18 0614 100 %     Weight 01/10/18 0610 260 lb (117.9 kg)     Height 01/10/18 0610 5\' 9"  (1.753 m)     Head Circumference --      Peak Flow --  Pain Score 01/10/18 0609 6     Pain Loc --      Pain Edu? --      Excl. in GC? --    Constitutional: Alert and oriented. Well appearing and in no acute distress. Nose: Edematous nasal turbinates clear rhinorrhea.   Mouth/Throat: Mucous membranes are moist.  Oropharynx non-erythematous.  Postnasal drainage. Neck: No stridor.  Hematological/Lymphatic/Immunilogical: No cervical lymphadenopathy. Cardiovascular: Normal rate, regular rhythm. Grossly normal heart sounds.  Good peripheral circulation. Respiratory: Normal  respiratory effort.  No retractions. Lungs CTAB. Musculoskeletal: No lower extremity tenderness nor edema.  No joint effusions. Neurologic:  Normal speech and language. No gross focal neurologic deficits are appreciated. No gait instability. Skin:  Skin is warm, dry and intact. No rash noted. Psychiatric: Mood and affect are normal. Speech and behavior are normal.  ____________________________________________   LABS (all labs ordered are listed, but only abnormal results are displayed)  Labs Reviewed  INFLUENZA PANEL BY PCR (TYPE A & B)   ____________________________________________  EKG   ____________________________________________  RADIOLOGY  ED MD interpretation:    Official radiology report(s): No results found.  ____________________________________________   PROCEDURES  Procedure(s) performed: None  Procedures  Critical Care performed:   ____________________________________________   INITIAL IMPRESSION / ASSESSMENT AND PLAN / ED COURSE  As part of my medical decision making, I reviewed the following data within the electronic MEDICAL RECORD NUMBER    Viral illness.  Discussed negative flu results with patient.  Patient given discharge care instruction work note.  Patient advised take medication as directed and follow-up PCP if condition persists.      ____________________________________________   FINAL CLINICAL IMPRESSION(S) / ED DIAGNOSES  Final diagnoses:  Influenza-like illness     ED Discharge Orders        Ordered    promethazine-dextromethorphan (PROMETHAZINE-DM) 6.25-15 MG/5ML syrup  4 times daily PRN     01/10/18 0736    lidocaine (XYLOCAINE) 2 % solution  Every 6 hours PRN     01/10/18 0736    ibuprofen (ADVIL,MOTRIN) 800 MG tablet  Every 8 hours PRN     01/10/18 0736       Note:  This document was prepared using Dragon voice recognition software and may include unintentional dictation errors.    Joni ReiningSmith, Adit Riddles K, PA-C 01/10/18  [id number]    Jene EveryKinner, Robert, MD 01/10/18 [phone]

## 2018-01-10 NOTE — ED Notes (Signed)
See triage note  Presents with cough and body aches couple of days ago  Denies any sore throat but feels like she has something stuck in throat  States she woke up body aches about 4 am  Afebrile on arrival

## 2018-01-17 ENCOUNTER — Other Ambulatory Visit: Payer: Self-pay

## 2018-01-17 ENCOUNTER — Emergency Department: Payer: BLUE CROSS/BLUE SHIELD

## 2018-01-17 ENCOUNTER — Emergency Department
Admission: EM | Admit: 2018-01-17 | Discharge: 2018-01-17 | Disposition: A | Discharge status: Home / Self Care | Payer: BLUE CROSS/BLUE SHIELD | Attending: Emergency Medicine | Admitting: Emergency Medicine

## 2018-01-17 DIAGNOSIS — Z79899 Other long term (current) drug therapy: Secondary | ICD-10-CM | POA: Diagnosis not present

## 2018-01-17 DIAGNOSIS — J069 Acute upper respiratory infection, unspecified: Secondary | ICD-10-CM

## 2018-01-17 DIAGNOSIS — B9789 Other viral agents as the cause of diseases classified elsewhere: Secondary | ICD-10-CM

## 2018-01-17 DIAGNOSIS — R05 Cough: Secondary | ICD-10-CM | POA: Diagnosis present

## 2018-01-17 MED ORDER — HYDROCODONE-HOMATROPINE 5-1.5 MG/5ML PO SYRP
5.0000 mL | ORAL_SOLUTION | Freq: Four times a day (QID) | ORAL | 0 refills | Status: DC | PRN
Start: 2018-01-17 — End: 2022-11-28

## 2018-01-17 MED ORDER — ALBUTEROL SULFATE HFA 108 (90 BASE) MCG/ACT IN AERS: INHALATION_SPRAY | RESPIRATORY_TRACT | 1 refills | Status: DC

## 2018-01-17 NOTE — Discharge Instructions (Signed)

## 2018-01-17 NOTE — ED Provider Notes (Signed)
[facility] Emergency Department Provider Note  ____________________________________________   None    (approximate)  I have reviewed the triage vital signs and the nursing notes.   HISTORY  Chief Complaint Cough    HPI Heidi Acevedo is a 47 y.o. female who is generally healthy with no reported past or chronic medical history.  She presents for evaluation of persistent cough as well as sore throat and nasal congestion for about a week.  She was seen in this emergency department 1 week ago and determined to have a viral syndrome.  Influenza testing was negative.  She was provided with a cough medication, some lidocaine for her sore throat, and ibuprofen tablets for discomfort.  She reports that she feels a little bit better overall although she thinks that she had a mild fever 2 days ago.  However she continues to feel badly in general with a severe cough.  She states that she can feel things rattling in her chest when she coughs but nothing comes up.  It is worse with deep breaths or exertion and slightly better with rest.  She also reports severe nasal congestion.  She is concerned she may have pneumonia.  She denies chest pain except for the discomfort from her cough, shortness of breath except for feeling short of breath when she is coughing, abdominal pain, nausea, vomiting, and dysuria.  No past medical history on file.  There are no active problems to display for this patient.   No past surgical history on file.  Prior to Admission medications   Medication Sig Start Date End Date Taking? Authorizing Provider  albuterol (PROVENTIL HFA;VENTOLIN HFA) 108 (90 Base) MCG/ACT inhaler Inhale 2-4 puffs by mouth every 4 hours as needed for wheezing, cough, and/or shortness of breath 01/17/18   Loleta Rose, MD  azithromycin (ZITHROMAX) 250 MG tablet Take 1 tablet (250 mg total) by mouth daily. Take first 2 tablets together, then 1 every day until finished.  06/22/13   Santiago Glad, PA-C  benzonatate (TESSALON) 100 MG capsule Take 1 capsule (100 mg total) by mouth every 8 (eight) hours. 06/22/13   Santiago Glad, PA-C  etodolac (LODINE) 200 MG capsule Take 1 capsule (200 mg total) by mouth every 8 (eight) hours. 08/26/17   Rebecka Apley, MD  HYDROcodone-homatropine [facility]) 5-1.5 MG/5ML syrup Take 5 mLs by mouth every 6 (six) hours as needed for cough. 01/17/18   Loleta Rose, MD  ibuprofen (ADVIL,MOTRIN) 200 MG tablet Take 600 mg by mouth every 6 (six) hours as needed. For pain    [provider]  ibuprofen (ADVIL,MOTRIN) 800 MG tablet Take 1 tablet (800 mg total) by mouth every 8 (eight) hours as needed for moderate pain. 01/10/18   Joni Reining, PA-C  lidocaine (XYLOCAINE) 2 % solution Use as directed 5 mLs in the mouth or throat every 6 (six) hours as needed for mouth pain. Mix with Phenergan DM for swish and swallow. 01/10/18   Joni Reining, PA-C  metroNIDAZOLE (FLAGYL) 500 MG tablet Take 1 tablet (500 mg total) by mouth 2 (two) times daily. 01/24/17   Irean Hong, MD  promethazine-dextromethorphan (PROMETHAZINE-DM) 6.25-15 MG/5ML syrup Take 5 mLs by mouth 4 (four) times daily as needed for cough. Mix with viscous lidocaine for swish and swallow. 01/10/18   Joni Reining, PA-C    Allergies Patient has no known allergies.  No family history on file.  Social History Social History   Tobacco Use  . Smoking  status: Never Smoker  . Smokeless tobacco: Never Used  Substance Use Topics  . Alcohol use: No  . Drug use: No    Review of Systems Constitutional: Intermittent subjective fever over the last week, most notable 2 days ago Eyes: No visual changes. ENT: Nasal congestion, sore throat.  No ear pain. Cardiovascular: Denies chest pain except for some discomfort when she coughs Respiratory: Persistent severe cough, "rattling" but nonproductive.  Some difficulty breathing associated with the cough. Gastrointestinal:  No abdominal pain.  No nausea, no vomiting.  No diarrhea.  No constipation. Genitourinary: Negative for dysuria. Musculoskeletal: Negative for neck pain.  Negative for back pain. Integumentary: Negative for rash. Neurological: Negative for headaches, focal weakness or numbness.   ____________________________________________   PHYSICAL EXAM:  VITAL SIGNS: ED Triage Vitals [01/17/18 0221]  Enc Vitals Group     BP 126/64     Pulse Rate 83     Resp 20     Temp (!) 97.5 F (36.4 C)     Temp Source Oral     SpO2 95 %     Weight 117.9 kg (260 lb)     Height 1.753 m (5\' 9" )     Head Circumference      Peak Flow      Pain Score 8     Pain Loc      Pain Edu?      Excl. in GC?     Constitutional: Alert and oriented. Well appearing and in no acute distress. Eyes: Conjunctivae are normal.  Head: Atraumatic. Nose: +congestion/rhinnorhea. Mouth/Throat: Mucous membranes are moist.  Oropharynx is nonerythematous with no exudate and no petechiae.  No swelling or evidence of abscess Neck: No stridor.  No meningeal signs.  No cervical lymphadenopathy, no submandibular tenderness, induration, or lymphadenopathy Cardiovascular: Normal rate, regular rhythm. Good peripheral circulation. Grossly normal heart sounds. Respiratory: Frequent thick sounding cough, but normal respiratory effort.  No retractions. Lungs CTAB. Gastrointestinal: Soft and nontender. No distention.  Musculoskeletal: No lower extremity tenderness nor edema. No gross deformities of extremities. Neurologic:  Normal speech and language. No gross focal neurologic deficits are appreciated.  Skin:  Skin is warm, dry and intact. No rash noted. Psychiatric: Mood and affect are normal. Speech and behavior are normal.  ____________________________________________   LABS (all labs ordered are listed, but only abnormal results are displayed)  Labs Reviewed - No data to  display ____________________________________________  EKG  None - EKG not ordered by ED physician ____________________________________________  RADIOLOGY I, Loleta Rose, personally viewed and evaluated these images (plain radiographs) as part of my medical decision making, as well as reviewing the written report by the radiologist.  ED MD interpretation: No indication of infiltrate  Official radiology report(s): Dg Chest 2 View  Result Date: 01/17/2018 CLINICAL DATA:  Initial evaluation for acute cough, congestion. EXAM: CHEST - 2 VIEW COMPARISON:  Prior radiograph from 06/22/2013. FINDINGS: Cardiac and mediastinal silhouettes are stable in size and contour, and remain within normal limits. Lungs mildly hypoinflated with elevation left hemidiaphragm, stable. No focal infiltrates. No pulmonary edema or pleural effusion. No pneumothorax. No acute osseus abnormality. IMPRESSION: No active cardiopulmonary disease. Electronically Signed   By: Rise Mu M.D.   On: 01/17/2018 03:21    ____________________________________________   PROCEDURES  Critical Care performed: No   Procedure(s) performed:   Procedures   ____________________________________________   INITIAL IMPRESSION / ASSESSMENT AND PLAN / ED COURSE  As part of my medical decision making, I reviewed the following  data within the electronic MEDICAL RECORD NUMBER Nursing notes reviewed and incorporated, Radiograph reviewed  and Notes from prior ED visits    Differential diagnosis includes viral syndrome, persistent bronchitis, community-acquired pneumonia, less likely PE or aspirated foreign body or pertussis.  The patient does have a noticeable cough although it has no whooping quality and is not staccato in nature.  She is not having any difficulty breathing and her vital signs are stable.  Chest x-ray reassuring.  I counseled her regarding the anticipated duration of symptoms associated with a viral syndrome and I  encouraged her to try the medication she was Artie given.  I will also provided prescription for an albuterol inhaler as it might help with the bronchospasm and opening up her airways.  She understands and agrees with the plan.     ____________________________________________  FINAL CLINICAL IMPRESSION(S) / ED DIAGNOSES  Final diagnoses:  Viral URI with cough     MEDICATIONS GIVEN DURING THIS VISIT:  Medications - No data to display   ED Discharge Orders        Ordered    albuterol (PROVENTIL HFA;VENTOLIN HFA) 108 (90 Base) MCG/ACT inhaler     01/17/18 0507    HYDROcodone-homatropine (HYCODAN) 5-1.5 MG/5ML syrup  Every 6 hours PRN     01/17/18 0507       Note:  This document was prepared using Dragon voice recognition software and may include unintentional dictation errors.    Loleta RoseForbach, Eleanor Gatliff, MD 01/17/18 [phone]

## 2018-01-17 NOTE — ED Notes (Signed)

## 2018-01-17 NOTE — ED Triage Notes (Signed)
Pt in with co cough, and congestion. Chest soreness when she coughs and takes a deep breath. Was seen here last week for the same, and given cough medicine. Pt here for persistent cough. Pt wants to be checked for pneumonia, states had a fever 2 days ago.

## 2018-03-31 ENCOUNTER — Other Ambulatory Visit: Payer: Self-pay

## 2018-03-31 ENCOUNTER — Emergency Department
Admission: EM | Admit: 2018-03-31 | Discharge: 2018-03-31 | Disposition: A | Discharge status: Home / Self Care | Payer: BLUE CROSS/BLUE SHIELD | Attending: Emergency Medicine | Admitting: Emergency Medicine

## 2018-03-31 ENCOUNTER — Encounter: Payer: Self-pay | Admitting: Emergency Medicine

## 2018-03-31 DIAGNOSIS — K529 Noninfective gastroenteritis and colitis, unspecified: Secondary | ICD-10-CM | POA: Insufficient documentation

## 2018-03-31 DIAGNOSIS — R112 Nausea with vomiting, unspecified: Secondary | ICD-10-CM | POA: Diagnosis present

## 2018-03-31 LAB — COMPREHENSIVE METABOLIC PANEL
ALK PHOS: 73 U/L (ref 38–126)
ALT: 21 U/L (ref 14–54)
AST: 26 U/L (ref 15–41)
Albumin: 3.7 g/dL (ref 3.5–5.0)
Anion gap: 9 (ref 5–15)
BUN: 10 mg/dL (ref 6–20)
CALCIUM: 8.7 mg/dL — AB (ref 8.9–10.3)
CHLORIDE: 103 mmol/L (ref 101–111)
CO2: 23 mmol/L (ref 22–32)
CREATININE: 0.75 mg/dL (ref 0.44–1.00)
GFR calc non Af Amer: >60 mL/min (ref >60)
Glucose, Bld: 117 mg/dL — ABNORMAL HIGH (ref 65–99)
Potassium: 4 mmol/L (ref 3.5–5.1)
SODIUM: 135 mmol/L (ref 135–145)
Total Bilirubin: 0.7 mg/dL (ref 0.3–1.2)
Total Protein: 8.2 g/dL — ABNORMAL HIGH (ref 6.5–8.1)

## 2018-03-31 LAB — URINALYSIS, COMPLETE (UACMP) WITH MICROSCOPIC
Bacteria, UA: NONE SEEN
Bilirubin Urine: NEGATIVE
GLUCOSE, UA: NEGATIVE mg/dL
Hgb urine dipstick: NEGATIVE
KETONES UR: NEGATIVE mg/dL
Leukocytes, UA: NEGATIVE
Nitrite: NEGATIVE
PH: 6 (ref 5.0–8.0)
Protein, ur: NEGATIVE mg/dL
SPECIFIC GRAVITY, URINE: 1.023 (ref 1.005–1.030)

## 2018-03-31 LAB — CBC
HCT: 40 % (ref 35.0–47.0)
Hemoglobin: 13.3 g/dL (ref 12.0–16.0)
MCH: 28.5 pg (ref 26.0–34.0)
MCHC: 33.3 g/dL (ref 32.0–36.0)
MCV: 85.6 fL (ref 80.0–100.0)
PLATELETS: 314 10*3/uL (ref 150–440)
RBC: 4.67 MIL/uL (ref 3.80–5.20)
RDW: 14.5 % (ref 11.5–14.5)
WBC: 8.3 10*3/uL (ref 3.6–11.0)

## 2018-03-31 LAB — LIPASE, BLOOD: Lipase: 21 U/L (ref 11–51)

## 2018-03-31 MED ORDER — SODIUM CHLORIDE 0.9 % IV BOLUS
1000.0000 mL | Freq: Once | INTRAVENOUS | Status: AC
  Administered 2018-03-31: 1000 mL via INTRAVENOUS

## 2018-03-31 MED ORDER — GI COCKTAIL ~~LOC~~
30.0000 mL | Freq: Once | ORAL | Status: AC
  Administered 2018-03-31: 30 mL via ORAL
  Filled 2018-03-31: qty 30

## 2018-03-31 MED ORDER — ONDANSETRON HCL 4 MG PO TABS: 4.0000 mg | ORAL_TABLET | Freq: Every day | ORAL | 0 refills | Status: DC | PRN

## 2018-03-31 MED ORDER — METOCLOPRAMIDE HCL 5 MG/ML IJ SOLN
10.0000 mg | Freq: Once | INTRAMUSCULAR | Status: AC
  Administered 2018-03-31: 10 mg via INTRAVENOUS
  Filled 2018-03-31: qty 2

## 2018-03-31 MED ORDER — ONDANSETRON HCL 4 MG/2ML IJ SOLN
4.0000 mg | Freq: Once | INTRAMUSCULAR | Status: AC
  Administered 2018-03-31: 4 mg via INTRAVENOUS
  Filled 2018-03-31: qty 2

## 2018-03-31 NOTE — ED Notes (Signed)
Pt drinking water at this time to assess ability to tolerate PO intake following anti-emetic medication administered by previous RN, Paulette.

## 2018-03-31 NOTE — ED Triage Notes (Signed)
Pt to triage via w/c with no distress noted; pt reports N/V/D since this am

## 2018-03-31 NOTE — ED Provider Notes (Signed)
[facility] Emergency Department Provider Note  ____________________________________________   I have reviewed the triage vital signs and the nursing notes. Where available I have reviewed prior notes and, if possible and indicated, outside hospital notes.    HISTORY  Chief Complaint Emesis and Diarrhea    HPI Heidi Acevedo is a 47 y.o. female  Presents today complaining of nausea vomiting diarrhea since this morning.  Epigastric discomfort.  No fever no chills no melena no bright red blood per rectum innumerable vomiting and innumerable watery diarrhea episodes.  Fever chills positive sick contacts in the community with similar, very large to be needed burden of viral enteritis at this time with multiple patient presented to the ER with similar.  She denies pregnancy. Postmenopausal.  No antecedent symptoms, nothing makes it better nothing makes it worse, not vomiting at this time.  Feels that she is depleted.  Pain is minimal discomfort especially when vomiting.  No chest pain no shortness of breath radiation, no prior treatment  History reviewed. No pertinent past medical history.  There are no active problems to display for this patient.   History reviewed. No pertinent surgical history.  Prior to Admission medications   Medication Sig Start Date End Date Taking? Authorizing Provider  albuterol (PROVENTIL HFA;VENTOLIN HFA) 108 (90 Base) MCG/ACT inhaler Inhale 2-4 puffs by mouth every 4 hours as needed for wheezing, cough, and/or shortness of breath 01/17/18   Loleta RoseForbach, Cory, MD  azithromycin (ZITHROMAX) 250 MG tablet Take 1 tablet (250 mg total) by mouth daily. Take first 2 tablets together, then 1 every day until finished. 06/22/13   Santiago GladLaisure, Heather, PA-C  benzonatate (TESSALON) 100 MG capsule Take 1 capsule (100 mg total) by mouth every 8 (eight) hours. 06/22/13   Santiago GladLaisure, Heather, PA-C  etodolac (LODINE) 200 MG capsule Take 1 capsule (200 mg total) by  mouth every 8 (eight) hours. 08/26/17   Rebecka ApleyWebster, Allison P, MD  HYDROcodone-homatropine [facility](HYCODAN) 5-1.5 MG/5ML syrup Take 5 mLs by mouth every 6 (six) hours as needed for cough. 01/17/18   Loleta RoseForbach, Cory, MD  ibuprofen (ADVIL,MOTRIN) 200 MG tablet Take 600 mg by mouth every 6 (six) hours as needed. For pain    [provider]  ibuprofen (ADVIL,MOTRIN) 800 MG tablet Take 1 tablet (800 mg total) by mouth every 8 (eight) hours as needed for moderate pain. 01/10/18   Joni ReiningSmith, Ronald K, PA-C  lidocaine (XYLOCAINE) 2 % solution Use as directed 5 mLs in the mouth or throat every 6 (six) hours as needed for mouth pain. Mix with Phenergan DM for swish and swallow. 01/10/18   Joni ReiningSmith, Ronald K, PA-C  metroNIDAZOLE (FLAGYL) 500 MG tablet Take 1 tablet (500 mg total) by mouth 2 (two) times daily. 01/24/17   Irean HongSung, Jade J, MD  promethazine-dextromethorphan (PROMETHAZINE-DM) 6.25-15 MG/5ML syrup Take 5 mLs by mouth 4 (four) times daily as needed for cough. Mix with viscous lidocaine for swish and swallow. 01/10/18   Joni ReiningSmith, Ronald K, PA-C    Allergies Patient has no known allergies.  No family history on file.  Social History Social History   Tobacco Use  . Smoking status: Never Smoker  . Smokeless tobacco: Never Used  Substance Use Topics  . Alcohol use: No  . Drug use: No    Review of Systems Constitutional: No fever/chills Eyes: No visual changes. ENT: No sore throat. No stiff neck no neck pain Cardiovascular: Denies chest pain. Respiratory: Denies shortness of breath. Gastrointestinal:   no vomiting.  No diarrhea.  No constipation. Genitourinary: Negative for dysuria. Musculoskeletal: Negative lower extremity swelling Skin: Negative for rash. Neurological: Negative for severe headaches, focal weakness or numbness.   ____________________________________________   PHYSICAL EXAM:  VITAL SIGNS: ED Triage Vitals  Enc Vitals Group     BP 03/31/18 1929 134/83     Pulse Rate 03/31/18 1929  (!) 106     Resp 03/31/18 1929 18     Temp 03/31/18 1929 98.3 F (36.8 C)     Temp Source 03/31/18 1929 Oral     SpO2 03/31/18 1929 98 %     Weight 03/31/18 1928 260 lb (117.9 kg)     Height 03/31/18 1928 5\' 9"  (1.753 m)     Head Circumference --      Peak Flow --      Pain Score 03/31/18 1928 8     Pain Loc --      Pain Edu? --      Excl. in GC? --     Constitutional: Alert and oriented. Well appearing and in no acute distress. Eyes: Conjunctivae are normal Head: Atraumatic HEENT: No congestion/rhinnorhea. Mucous membranes are moist.  Oropharynx non-erythematous Neck:   Nontender with no meningismus, no masses, no stridor Cardiovascular: Normal rate, regular rhythm. Grossly normal heart sounds.  Good peripheral circulation. Respiratory: Normal respiratory effort.  No retractions. Lungs CTAB. Abdominal: Soft and slight epigastric discomfort noted. No distention. No guarding no rebound surgical abdomen obesity noted Back:  There is no focal tenderness or step off.  there is no midline tenderness there are no lesions noted. there is no CVA tenderness Musculoskeletal: No lower extremity tenderness, no upper extremity tenderness. No joint effusions, no DVT signs strong distal pulses no edema Neurologic:  Normal speech and language. No gross focal neurologic deficits are appreciated.  Skin:  Skin is warm, dry and intact. No rash noted. Psychiatric: Mood and affect are normal. Speech and behavior are normal.  ____________________________________________   LABS (all labs ordered are listed, but only abnormal results are displayed)  Labs Reviewed  COMPREHENSIVE METABOLIC PANEL - Abnormal; Notable for the following components:      Result Value   Glucose, Bld 117 (*)    Calcium 8.7 (*)    Total Protein 8.2 (*)    All other components within normal limits  URINALYSIS, COMPLETE (UACMP) WITH MICROSCOPIC - Abnormal; Notable for the following components:   Color, Urine YELLOW (*)     APPearance CLEAR (*)    All other components within normal limits  LIPASE, BLOOD  CBC  POC URINE PREG, ED    Pertinent labs  results that were available during my care of the patient were reviewed by me and considered in my medical decision making (see chart for details). ____________________________________________  EKG  I personally interpreted any EKGs ordered by me or triage  ____________________________________________  RADIOLOGY  Pertinent labs & imaging results that were available during my care of the patient were reviewed by me and considered in my medical decision making (see chart for details). If possible, patient and/or family made aware of any abnormal findings.  No results found. ____________________________________________    PROCEDURES  Procedure(s) performed: None  Procedures  Critical Care performed: None  ____________________________________________   INITIAL IMPRESSION / ASSESSMENT AND PLAN / ED COURSE  Pertinent labs & imaging results that were available during my care of the patient were reviewed by me and considered in my medical decision making (see chart for details).  Diarrhea, very well-appearing, will give  her IV fluid and Zofran, patient has had symptoms for approximately 16 hours, is my hope that we can her safely home.  Low suspicion for intra-abdominal pathology such as appendicitis gallbladder disease PID etc.    ____________________________________________   FINAL CLINICAL IMPRESSION(S) / ED DIAGNOSES  Final diagnoses:  None      This chart was dictated using voice recognition software.  Despite best efforts to proofread,  errors can occur which can change meaning.      Jeanmarie Plant, MD 03/31/18 2107

## 2018-03-31 NOTE — ED Notes (Signed)
Pt reports no c/o N/V following PO intake.

## 2018-03-31 NOTE — Discharge Instructions (Signed)
Return to the emergency room for any new or worrisome symptoms including vomiting, fever or abdominal pain, follow closely with primary care doctor take the medications as prescribed drink plenty of fluids.

## 2018-03-31 NOTE — ED Notes (Signed)
Pt states "I feel like crap." When asked to define what that means, pt states her pain has improved but that her nausea has not. No episodes of vomiting since she was roomed.

## 2018-03-31 NOTE — ED Notes (Signed)
ED Provider at bedside. 

## 2018-11-30 ENCOUNTER — Emergency Department
Admission: EM | Admit: 2018-11-30 | Discharge: 2018-11-30 | Disposition: A | Discharge status: Home / Self Care | Payer: BLUE CROSS/BLUE SHIELD | Attending: Student in an Organized Health Care Education/Training Program | Admitting: Student in an Organized Health Care Education/Training Program

## 2018-11-30 ENCOUNTER — Emergency Department: Payer: BLUE CROSS/BLUE SHIELD

## 2018-11-30 ENCOUNTER — Encounter: Payer: Self-pay | Admitting: Emergency Medicine

## 2018-11-30 DIAGNOSIS — R2243 Localized swelling, mass and lump, lower limb, bilateral: Secondary | ICD-10-CM | POA: Insufficient documentation

## 2018-11-30 DIAGNOSIS — M25571 Pain in right ankle and joints of right foot: Secondary | ICD-10-CM | POA: Insufficient documentation

## 2018-11-30 DIAGNOSIS — M7989 Other specified soft tissue disorders: Secondary | ICD-10-CM

## 2018-11-30 DIAGNOSIS — Z79899 Other long term (current) drug therapy: Secondary | ICD-10-CM | POA: Insufficient documentation

## 2018-11-30 LAB — BASIC METABOLIC PANEL
ANION GAP: 4 — AB (ref 5–15)
BUN: 11 mg/dL (ref 6–20)
CALCIUM: 8.9 mg/dL (ref 8.9–10.3)
CO2: 28 mmol/L (ref 22–32)
Chloride: 105 mmol/L (ref 98–111)
Creatinine, Ser: 0.67 mg/dL (ref 0.44–1.00)
Glucose, Bld: 116 mg/dL — ABNORMAL HIGH (ref 70–99)
Potassium: 4.5 mmol/L (ref 3.5–5.1)
SODIUM: 137 mmol/L (ref 135–145)

## 2018-11-30 LAB — URINALYSIS, COMPLETE (UACMP) WITH MICROSCOPIC
BACTERIA UA: NONE SEEN
Bilirubin Urine: NEGATIVE
Glucose, UA: NEGATIVE mg/dL
Hgb urine dipstick: NEGATIVE
KETONES UR: NEGATIVE mg/dL
Leukocytes, UA: NEGATIVE
Nitrite: NEGATIVE
PROTEIN: NEGATIVE mg/dL
Specific Gravity, Urine: 1.03 (ref 1.005–1.030)
pH: 5 (ref 5.0–8.0)

## 2018-11-30 MED ORDER — TRAMADOL HCL 50 MG PO TABS: 50.0000 mg | ORAL_TABLET | Freq: Four times a day (QID) | ORAL | 0 refills | Status: AC | PRN

## 2018-11-30 NOTE — ED Notes (Signed)
Pt transported to US

## 2018-11-30 NOTE — ED Notes (Signed)
Patient c/o bilateral ankle swelling X 2 days. Patient denies injury. Patient denies hx of PVD, CHF, and gout. Patient reports family hx of CHF. Patient denies orthopnea. No pitting edema seen on assessment of lower extremities.

## 2018-11-30 NOTE — ED Notes (Signed)
Patient refused ride in wheelchair to lobby during discharge

## 2018-11-30 NOTE — ED Triage Notes (Signed)
Patient with complaint of pain and swelling to bilateral ankles that started yesterday. Patient denies any injury.

## 2018-11-30 NOTE — ED Provider Notes (Signed)
[facility] Emergency Department Provider Note    First MD Initiated Contact with Patient 11/30/18 0700     (approximate)  I have reviewed the triage vital signs and the nursing notes.   HISTORY  Chief Complaint Foot Swelling    HPI Heidi Acevedo is a 48 y.o. female presents the ER for evaluation of bilateral lower extremity swelling as well as right ankle pain and discomfort particularly with walking.  No recent fevers.  Denies any shortness of breath orthopnea.  Does have family history of diabetes, hypertension and congestive heart failure.  Does also have family history of gout.  Denies any known trauma but has had worsening discomfort when walking.  History reviewed. No pertinent past medical history. No family history on file. Past Surgical History:  Procedure Laterality Date  . ECTOPIC PREGNANCY SURGERY     There are no active problems to display for this patient.     Prior to Admission medications   Medication Sig Start Date End Date Taking? Authorizing Provider  albuterol (PROVENTIL HFA;VENTOLIN HFA) 108 (90 Base) MCG/ACT inhaler Inhale 2-4 puffs by mouth every 4 hours as needed for wheezing, cough, and/or shortness of breath 01/17/18   Loleta Rose, MD  azithromycin (ZITHROMAX) 250 MG tablet Take 1 tablet (250 mg total) by mouth daily. Take first 2 tablets together, then 1 every day until finished. 06/22/13   Santiago Glad, PA-C  benzonatate (TESSALON) 100 MG capsule Take 1 capsule (100 mg total) by mouth every 8 (eight) hours. 06/22/13   Santiago Glad, PA-C  etodolac (LODINE) 200 MG capsule Take 1 capsule (200 mg total) by mouth every 8 (eight) hours. 08/26/17   Rebecka Apley, MD  HYDROcodone-homatropine [facility]) 5-1.5 MG/5ML syrup Take 5 mLs by mouth every 6 (six) hours as needed for cough. 01/17/18   Loleta Rose, MD  ibuprofen (ADVIL,MOTRIN) 200 MG tablet Take 600 mg by mouth every 6 (six) hours as needed. For pain    [provider]  ibuprofen (ADVIL,MOTRIN) 800 MG tablet Take 1 tablet (800 mg total) by mouth every 8 (eight) hours as needed for moderate pain. 01/10/18   Joni Reining, PA-C  lidocaine (XYLOCAINE) 2 % solution Use as directed 5 mLs in the mouth or throat every 6 (six) hours as needed for mouth pain. Mix with Phenergan DM for swish and swallow. 01/10/18   Joni Reining, PA-C  metroNIDAZOLE (FLAGYL) 500 MG tablet Take 1 tablet (500 mg total) by mouth 2 (two) times daily. 01/24/17   Irean Hong, MD  ondansetron (ZOFRAN) 4 MG tablet Take 1 tablet (4 mg total) by mouth daily as needed for nausea or vomiting. 03/31/18   Jeanmarie Plant, MD  promethazine-dextromethorphan (PROMETHAZINE-DM) 6.25-15 MG/5ML syrup Take 5 mLs by mouth 4 (four) times daily as needed for cough. Mix with viscous lidocaine for swish and swallow. 01/10/18   Joni Reining, PA-C  traMADol (ULTRAM) 50 MG tablet Take 1 tablet (50 mg total) by mouth every 6 (six) hours as needed. 11/30/18 11/30/19  Willy Eddy, MD    Allergies Patient has no known allergies.    Social History Social History   Tobacco Use  . Smoking status: Never Smoker  . Smokeless tobacco: Never Used  Substance Use Topics  . Alcohol use: No  . Drug use: No    Review of Systems Patient denies headaches, rhinorrhea, blurry vision, numbness, shortness of breath, chest pain, edema, cough, abdominal pain, nausea, vomiting, diarrhea, dysuria, fevers, rashes  or hallucinations unless otherwise stated above in HPI. ____________________________________________   PHYSICAL EXAM:  VITAL SIGNS: Vitals:   11/30/18 0019 11/30/18 0817  BP: 126/80 120/78  Pulse: 79 70  Resp: 18 16  Temp: 97.9 F (36.6 C)   SpO2: 99% 98%    Constitutional: Alert and oriented. Well appearing and in no acute distress. Eyes: Conjunctivae are normal.  Head: Atraumatic. Nose: No congestion/rhinnorhea. Mouth/Throat: Mucous membranes are moist.   Neck: Painless ROM.    Cardiovascular:   Good peripheral circulation. Respiratory: Normal respiratory effort.  No retractions.  Gastrointestinal: Soft and nontender.  Musculoskeletal: BLE non pitting edema and ttp of medial mal.  No joint effusions. Neurologic:  Normal speech and language. No gross focal neurologic deficits are appreciated.  Skin:  Skin is warm, dry and intact. No rash noted. Psychiatric: Mood and affect are normal. Speech and behavior are normal.  ____________________________________________   LABS (all labs ordered are listed, but only abnormal results are displayed)  Results for orders placed or performed during the hospital encounter of 11/30/18 (from the past 24 hour(s))  Urinalysis, Complete w Microscopic     Status: Abnormal   Collection Time: 11/30/18  8:14 AM  Result Value Ref Range   Color, Urine AMBER (A) YELLOW   APPearance CLOUDY (A) CLEAR   Specific Gravity, Urine 1.030 1.005 - 1.030   pH 5.0 5.0 - 8.0   Glucose, UA NEGATIVE NEGATIVE mg/dL   Hgb urine dipstick NEGATIVE NEGATIVE   Bilirubin Urine NEGATIVE NEGATIVE   Ketones, ur NEGATIVE NEGATIVE mg/dL   Protein, ur NEGATIVE NEGATIVE mg/dL   Nitrite NEGATIVE NEGATIVE   Leukocytes, UA NEGATIVE NEGATIVE   RBC / HPF 0-5 0 - 5 RBC/hpf   WBC, UA 0-5 0 - 5 WBC/hpf   Bacteria, UA NONE SEEN NONE SEEN   Squamous Epithelial / LPF 11-20 0 - 5   Mucus PRESENT   Basic metabolic panel     Status: Abnormal   Collection Time: 11/30/18  8:15 AM  Result Value Ref Range   Sodium 137 135 - 145 mmol/L   Potassium 4.5 3.5 - 5.1 mmol/L   Chloride 105 98 - 111 mmol/L   CO2 28 22 - 32 mmol/L   Glucose, Bld 116 (H) 70 - 99 mg/dL   BUN 11 6 - 20 mg/dL   Creatinine, Ser 4.090.67 0.44 - 1.00 mg/dL   Calcium 8.9 8.9 - 81.110.3 mg/dL   GFR calc non Af Amer >60 >60 mL/min   GFR calc Af Amer >60 >60 mL/min   Anion gap 4 (L) 5 - 15   ____________________________________________ _________________________________  RADIOLOGY  I personally reviewed  all radiographic images ordered to evaluate for the above acute complaints and reviewed radiology reports and findings.  These findings were personally discussed with the patient.  Please see medical record for radiology report.  ____________________________________________   PROCEDURES  Procedure(s) performed:  Procedures    Critical Care performed: no ____________________________________________   INITIAL IMPRESSION / ASSESSMENT AND PLAN / ED COURSE  Pertinent labs & imaging results that were available during my care of the patient were reviewed by me and considered in my medical decision making (see chart for details).  DDX: Edema, venous stasis, DVT, fracture, arthritis  Heidi Acevedo is a 48 y.o. who presents to the ED with symptoms as described above.  Exam is fairly reassuring.  Certainly no evidence of acute ischemia, infectious process, gout or other insidious process.  Blood work ultrasound and x-ray ordered for  the above differential was also reassuring.  This point believe she stable and appropriate for outpatient follow-up.      ____________________________________________   FINAL CLINICAL IMPRESSION(S) / ED DIAGNOSES  Final diagnoses:  Acute right ankle pain  Leg swelling      NEW MEDICATIONS STARTED DURING THIS VISIT:  New Prescriptions   TRAMADOL (ULTRAM) 50 MG TABLET    Take 1 tablet (50 mg total) by mouth every 6 (six) hours as needed.     Note:  This document was prepared using Dragon voice recognition software and may include unintentional dictation errors.     Willy Eddy, MD 11/30/18 [phone]

## 2019-04-24 ENCOUNTER — Other Ambulatory Visit: Payer: Self-pay | Admitting: Podiatry

## 2019-04-24 ENCOUNTER — Other Ambulatory Visit: Payer: Self-pay

## 2019-04-24 ENCOUNTER — Encounter: Payer: Self-pay | Admitting: Podiatry

## 2019-04-24 ENCOUNTER — Ambulatory Visit (INDEPENDENT_AMBULATORY_CARE_PROVIDER_SITE_OTHER): Payer: BC Managed Care – PPO

## 2019-04-24 ENCOUNTER — Ambulatory Visit: Payer: BC Managed Care – PPO | Admitting: Podiatry

## 2019-04-24 VITALS — BP 113/79 | HR 71 | Temp 97.9°F

## 2019-04-24 DIAGNOSIS — M659 Synovitis and tenosynovitis, unspecified: Secondary | ICD-10-CM

## 2019-04-24 DIAGNOSIS — M722 Plantar fascial fibromatosis: Secondary | ICD-10-CM

## 2019-04-24 DIAGNOSIS — M65979 Unspecified synovitis and tenosynovitis, unspecified ankle and foot: Secondary | ICD-10-CM

## 2019-04-24 MED ORDER — MELOXICAM 15 MG PO TABS: 15.0000 mg | ORAL_TABLET | Freq: Every day | ORAL | 1 refills | Status: DC

## 2019-04-24 MED ORDER — METHYLPREDNISOLONE 4 MG PO TBPK: ORAL_TABLET | ORAL | 0 refills | Status: DC

## 2019-04-27 NOTE — Progress Notes (Signed)
   Subjective:  48 year old female presenting today as a new patient with a chief complaint of bilateral ankle pain that began about two months ago. She also reports left plantar heel pain that began two weeks ago. She reports associated swelling and sharp pain in the ankles. She states the heel pain is constant. Standing for long periods of time increases the pain. She has not had any treatment for her symptoms. Patient is here for further evaluation and treatment.   History reviewed. No pertinent past medical history.   Objective/Physical Exam General: The patient is alert and oriented x3 in no acute distress.  Dermatology: Skin is warm, dry and supple bilateral lower extremities. Negative for open lesions or macerations.  Vascular: Palpable pedal pulses bilaterally. No edema or erythema noted. Capillary refill within normal limits.  Neurological: Epicritic and protective threshold grossly intact bilaterally.   Musculoskeletal Exam: Range of motion within normal limits to all pedal and ankle joints bilateral. Muscle strength 5/5 in all groups bilateral.  Upon weightbearing there is a medial longitudinal arch collapse bilaterally. Remove foot valgus noted to the bilateral lower extremities with excessive pronation upon mid stance. Tenderness to palpation to the plantar aspect of the left heel along the plantar fascia as well as to the anterior, lateral and medial aspects of the bilateral ankle joints.   Radiographic Exam:  Normal osseous mineralization. Joint spaces preserved. No fracture/dislocation/boney destruction.   Pes planus noted on radiographic exam lateral views. Decreased calcaneal inclination and metatarsal declination angle is noted. Anterior break in the cyma line noted on lateral views. Medial talar head to deviation noted on AP radiograph.   Assessment: 1. pes planus bilateral 2. Ankle synovitis bilateral  3. Plantar fasciitis left    Plan of Care:  1. Patient was  evaluated. X-Rays reviewed.  2. Injection of 0.5 mLs Celestone Soluspan injected into the bilateral ankle joints.  3. Injection of 0.5 mLs Celestone Soluspan injected into the left heel along the plantar fascia.  4. Prescription for Medrol Dose Pak provided to patient. 5. Prescription for Meloxicam provided to patient. 6. Compression anklets dispensed bilaterally.  7. Return to clinic in 4 weeks.   Goes by [employer]. 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 Currently on LOA taking care of mother.    Edrick Kins, DPM [facility]  Dr. Edrick Kins, [address]                                        [place], [place] [address]                Office [phone]  Fax [phone]

## 2019-04-28 ENCOUNTER — Other Ambulatory Visit: Payer: Self-pay

## 2019-04-28 MED ORDER — MELOXICAM 15 MG PO TABS: 15.0000 mg | ORAL_TABLET | Freq: Every day | ORAL | 1 refills | Status: DC

## 2019-04-28 MED ORDER — METHYLPREDNISOLONE 4 MG PO TBPK: ORAL_TABLET | ORAL | 0 refills | Status: DC

## 2019-04-28 NOTE — Progress Notes (Signed)
Rx Prednisone Pak and Meloxicam printed for patient pick up

## 2019-05-22 ENCOUNTER — Ambulatory Visit: Payer: BC Managed Care – PPO | Admitting: Podiatry

## 2020-06-30 DIAGNOSIS — M79676 Pain in unspecified toe(s): Secondary | ICD-10-CM

## 2020-07-22 ENCOUNTER — Other Ambulatory Visit: Payer: Self-pay | Admitting: Dentistry

## 2020-07-22 ENCOUNTER — Ambulatory Visit
Admission: RE | Admit: 2020-07-22 | Discharge: 2020-07-22 | Disposition: A | Discharge status: Home / Self Care | Payer: Disability Insurance | Source: Ambulatory Visit | Attending: Dentistry | Admitting: Dentistry

## 2020-07-22 ENCOUNTER — Ambulatory Visit
Admission: RE | Admit: 2020-07-22 | Discharge: 2020-07-22 | Disposition: A | Discharge status: Home / Self Care | Payer: Disability Insurance | Attending: Dentistry | Admitting: Dentistry

## 2020-07-22 DIAGNOSIS — R52 Pain, unspecified: Secondary | ICD-10-CM

## 2020-08-23 ENCOUNTER — Ambulatory Visit
Admission: RE | Admit: 2020-08-23 | Discharge: 2020-08-23 | Disposition: A | Discharge status: Home / Self Care | Payer: Disability Insurance | Source: Ambulatory Visit | Attending: Internal Medicine | Admitting: Internal Medicine

## 2020-08-23 ENCOUNTER — Ambulatory Visit
Admission: RE | Admit: 2020-08-23 | Discharge: 2020-08-23 | Disposition: A | Discharge status: Home / Self Care | Payer: Disability Insurance | Attending: Internal Medicine | Admitting: Internal Medicine

## 2020-08-23 ENCOUNTER — Other Ambulatory Visit: Payer: Self-pay | Admitting: Internal Medicine

## 2020-08-23 DIAGNOSIS — M1990 Unspecified osteoarthritis, unspecified site: Secondary | ICD-10-CM

## 2021-02-22 ENCOUNTER — Other Ambulatory Visit: Payer: Self-pay

## 2021-02-22 ENCOUNTER — Emergency Department
Admission: EM | Admit: 2021-02-22 | Discharge: 2021-02-22 | Disposition: A | Discharge status: Home / Self Care | Payer: BLUE CROSS/BLUE SHIELD | Attending: Student in an Organized Health Care Education/Training Program | Admitting: Student in an Organized Health Care Education/Training Program

## 2021-02-22 ENCOUNTER — Encounter: Payer: Self-pay | Admitting: Emergency Medicine

## 2021-02-22 ENCOUNTER — Emergency Department: Payer: BLUE CROSS/BLUE SHIELD

## 2021-02-22 DIAGNOSIS — M19071 Primary osteoarthritis, right ankle and foot: Secondary | ICD-10-CM | POA: Insufficient documentation

## 2021-02-22 MED ORDER — MELOXICAM 15 MG PO TABS: 15.0000 mg | ORAL_TABLET | Freq: Every day | ORAL | 0 refills | Status: AC

## 2021-02-22 MED ORDER — MELOXICAM 7.5 MG PO TABS
15.0000 mg | ORAL_TABLET | Freq: Once | ORAL | Status: AC
  Administered 2021-02-22: 15 mg via ORAL
  Filled 2021-02-22: qty 2

## 2021-02-22 NOTE — Discharge Instructions (Signed)
Please use the anti-inflammatory Mobic once daily as was sent to your pharmacy.  You may also use  Tylenol, up to 1000 mg 4 times daily.  Please follow-up with podiatry regarding your right foot and ankle pain.  Return to the emergency department for any worsening.

## 2021-02-22 NOTE — ED Provider Notes (Signed)
[facility] Emergency Department Provider Note  ____________________________________________   Event Date/Time   First MD Initiated Contact with Patient 02/22/21 1450     (approximate)  I have reviewed the triage vital signs and the nursing notes.   HISTORY  Chief Complaint Ankle Pain   HPI Kween Bacorn is a 50 y.o. female who presents to the emergency department for evaluation of right foot and ankle pain.  Patient states that she has had progressively worsening pain over the last month that has been insidious in onset, no acute traumas or falls to incite her pain.  She states that she has history of similar and was evaluated here and told that she has "edema".  She denies any pain swelling or redness into the calves, denies shortness of breath, orthopnea.  She denies history of blood clots, cardiac disease or diabetes.        History reviewed. No pertinent past medical history.  There are no problems to display for this patient.   Past Surgical History:  Procedure Laterality Date  . ECTOPIC PREGNANCY SURGERY      Prior to Admission medications   Medication Sig Start Date End Date Taking? Authorizing Provider  meloxicam (MOBIC) 15 MG tablet Take 1 tablet (15 mg total) by mouth daily for 15 days. 02/22/21 03/09/21 Yes Jyaire Koudelka, Ruben Gottron, PA  albuterol (PROVENTIL HFA;VENTOLIN HFA) 108 (90 Base) MCG/ACT inhaler Inhale 2-4 puffs by mouth every 4 hours as needed for wheezing, cough, and/or shortness of breath Patient not taking: Reported on 04/24/2019 01/17/18   Loleta Rose, MD  azithromycin (ZITHROMAX) 250 MG tablet Take 1 tablet (250 mg total) by mouth daily. Take first 2 tablets together, then 1 every day until finished. Patient not taking: Reported on 04/24/2019 06/22/13   Santiago Glad, PA-C  benzonatate (TESSALON) 100 MG capsule Take 1 capsule (100 mg total) by mouth every 8 (eight) hours. Patient not taking: Reported on 04/24/2019 06/22/13    Santiago Glad, PA-C  HYDROcodone-homatropine [facility]) 5-1.5 MG/5ML syrup Take 5 mLs by mouth every 6 (six) hours as needed for cough. Patient not taking: Reported on 04/24/2019 01/17/18   Loleta Rose, MD  ibuprofen (ADVIL,MOTRIN) 200 MG tablet Take 600 mg by mouth every 6 (six) hours as needed. For pain    [provider]  ibuprofen (ADVIL,MOTRIN) 800 MG tablet Take 1 tablet (800 mg total) by mouth every 8 (eight) hours as needed for moderate pain. Patient not taking: Reported on 04/24/2019 01/10/18   Joni Reining, PA-C  lidocaine (XYLOCAINE) 2 % solution Use as directed 5 mLs in the mouth or throat every 6 (six) hours as needed for mouth pain. Mix with Phenergan DM for swish and swallow. Patient not taking: Reported on 04/24/2019 01/10/18   Joni Reining, PA-C  methylPREDNISolone (MEDROL DOSEPAK) 4 MG TBPK tablet 6 day dose pack - take as directed 04/28/19   Felecia Shelling, DPM  metroNIDAZOLE (FLAGYL) 500 MG tablet Take 1 tablet (500 mg total) by mouth 2 (two) times daily. Patient not taking: Reported on 04/24/2019 01/24/17   Irean Hong, MD  ondansetron (ZOFRAN) 4 MG tablet Take 1 tablet (4 mg total) by mouth daily as needed for nausea or vomiting. Patient not taking: Reported on 04/24/2019 03/31/18   Jeanmarie Plant, MD  promethazine-dextromethorphan (PROMETHAZINE-DM) 6.25-15 MG/5ML syrup Take 5 mLs by mouth 4 (four) times daily as needed for cough. Mix with viscous lidocaine for swish and swallow. Patient not taking: Reported on 04/24/2019 01/10/18  Joni Reining, PA-C    Allergies Patient has no known allergies.  History reviewed. No pertinent family history.  Social History Social History   Tobacco Use  . Smoking status: Never Smoker  . Smokeless tobacco: Never Used  Substance Use Topics  . Alcohol use: No  . Drug use: No    Review of Systems Constitutional: No fever/chills Eyes: No visual changes. ENT: No sore throat. Cardiovascular: Denies chest  pain. Respiratory: Denies shortness of breath. Gastrointestinal: No abdominal pain.  No nausea, no vomiting.  No diarrhea.  No constipation. Genitourinary: Negative for dysuria. Musculoskeletal: + Right ankle pain, negative for back pain. Skin: Negative for rash. Neurological: Negative for headaches, focal weakness or numbness.   ____________________________________________   PHYSICAL EXAM:  VITAL SIGNS: ED Triage Vitals  Enc Vitals Group     BP 02/22/21 1412 (!) 139/119     Pulse Rate 02/22/21 1412 86     Resp 02/22/21 1412 20     Temp 02/22/21 1412 98.2 F (36.8 C)     Temp Source 02/22/21 1412 Oral     SpO2 02/22/21 1412 98 %     Weight 02/22/21 1411 280 lb (127 kg)     Height 02/22/21 1411 5\' 9"  (1.753 m)     Head Circumference --      Peak Flow --      Pain Score 02/22/21 1410 8     Pain Loc --      Pain Edu? --      Excl. in GC? --    Constitutional: Alert and oriented. Well appearing and in no acute distress. Eyes: Conjunctivae are normal. PERRL. EOMI. Head: Atraumatic. Nose: No congestion/rhinnorhea. Mouth/Throat: Mucous membranes are moist.  Oropharynx non-erythematous. Neck: No stridor.   Cardiovascular: Normal rate, regular rhythm. Grossly normal heart sounds.  Good peripheral circulation. Respiratory: Normal respiratory effort.  No retractions. Lungs CTAB. Musculoskeletal: There is soft tissue swelling present over the bilateral malleoli of the right ankle as well as anterior ankle joint line.  Swelling is similar on the contralateral side.  No pretibial pitting edema.  There is tenderness at the anterior joint line as well as into the midfoot.  Dorsal pedal pulses 2+, posterior tibial pulse 2+, capillary refill less than 3 seconds all digits.  Patient maintains appropriate range of motion of the right ankle but with increased pain at end dorsiflexion and plantarflexion.  No tenderness, increase in size or erythema to the right calf. Neurologic:  Normal speech  and language. No gross focal neurologic deficits are appreciated.  Gait not assessed secondary to right lower extremity pain. Skin:  Skin is warm, dry and intact. No rash noted. Psychiatric: Mood and affect are normal. Speech and behavior are normal.  ____________________________________________  RADIOLOGY I, 02/24/21, personally viewed and evaluated these images (plain radiographs) as part of my medical decision making, as well as reviewing the written report by the radiologist.  ED provider interpretation: X-ray of the right ankle reveals probable old fracture site of the medial malleolus, well-corticated and likely not new.  Patient also has diffuse degenerative changes noted in the midfoot, spurring at the calcaneus.  Official radiology report(s): DG Ankle Complete Right  Result Date: 02/22/2021 CLINICAL DATA:  Ankle pain, started 1 day ago pain on lateral and medial aspect of the ankle. No history of injury. No reported history of prior injury. EXAM: RIGHT ANKLE - COMPLETE 3+ VIEW COMPARISON:  Foot w exam April 24, 2019 FINDINGS: Generalized soft tissue  swelling, mild about the ankle. Ankle mortise is intact. Well corticated bone fragments are seen along the tip of the medial malleolus with unchanged appearance compared to prior imaging. No acute bone finding. Midfoot degenerative changes are noted similar to the prior study along with plantar and Achilles enthesopathy. Query small ankle effusion. IMPRESSION: 1. Mild soft tissue swelling without acute bony abnormality. Query small ankle effusion. 2. Signs of prior trauma to the medial malleolus, unchanged. Electronically Signed   By: Donzetta Kohut M.D.   On: 02/22/2021 15:02   ____________________________________________   INITIAL IMPRESSION / ASSESSMENT AND PLAN / ED COURSE  As part of my medical decision making, I reviewed the following data within the electronic MEDICAL RECORD NUMBER Nursing notes reviewed and incorporated,  Radiograph reviewed and Notes from prior ED visits        Patient is a 50 year old female who presents to the emergency department for evaluation of right ankle pain that is been progressively worsening over the last month.  She states that she works at [employer] and is regularly on her feet a lot.  See HPI for further details.  She was seen for similar complaint in June 2020 and x-ray today shows continued progression of degenerative changes.  She denies any cardiac history, shortness of breath, swelling or pain in the calf.  Physical exam shows tenderness along the anterior joint line of the ankle and midfoot with no significant decrease in range of motion.  She remains neurovascularly intact.  At this time, exam and findings most consistent with musculoskeletal pain from degenerative changes.  Upon chart review, she has seen podiatry before for similar complaint.  We will have her follow back up with Dr. Logan Bores regarding this.  In the interim, we will start her on anti-inflammatory and recommend Tylenol 4 times daily as well as provide temporary work note.  The patient is amenable with this plan.  She was advised to return to the emergency department if she experiences any erythema, swelling or pain in the calf, or shortness of breath.  Patient is amenable with plan, stable this time for outpatient follow-up.      ____________________________________________   FINAL CLINICAL IMPRESSION(S) / ED DIAGNOSES  Final diagnoses:  Arthritis of right ankle     ED Discharge Orders         Ordered    meloxicam (MOBIC) 15 MG tablet  Daily        02/22/21 1559          *Please note:  Garnet Chatmon was evaluated in Emergency Department on 02/22/2021 for the symptoms described in the history of present illness. She was evaluated in the context of the global COVID-19 pandemic, which necessitated consideration that the patient might be at risk for infection with the SARS-CoV-2 virus that causes COVID-19.  Institutional protocols and algorithms that pertain to the evaluation of patients at risk for COVID-19 are in a state of rapid change based on information released by regulatory bodies including the CDC and federal and state organizations. These policies and algorithms were followed during the patient's care in the ED.  Some ED evaluations and interventions may be delayed as a result of limited staffing during and the pandemic.*   Note:  This document was prepared using Dragon voice recognition software and may include unintentional dictation errors.   Lucy Chris, PA 02/22/21 1600    Willy Eddy, MD 02/22/21 [phone]

## 2021-02-22 NOTE — ED Triage Notes (Signed)
Pt to ED via POV with c/o R foot/ankle pain. Pt states long periods of standing at work. Pt states awoke after working last night with sharp pain to her R ankle, pain worse with standing at this time.

## 2021-05-18 ENCOUNTER — Encounter: Payer: Self-pay | Admitting: Emergency Medicine

## 2021-05-18 ENCOUNTER — Emergency Department
Admission: EM | Admit: 2021-05-18 | Discharge: 2021-05-18 | Disposition: A | Discharge status: Home / Self Care | Payer: BLUE CROSS/BLUE SHIELD | Attending: Emergency Medicine | Admitting: Emergency Medicine

## 2021-05-18 ENCOUNTER — Other Ambulatory Visit: Payer: Self-pay

## 2021-05-18 DIAGNOSIS — G5601 Carpal tunnel syndrome, right upper limb: Secondary | ICD-10-CM | POA: Insufficient documentation

## 2021-05-18 MED ORDER — MELOXICAM 7.5 MG PO TABS
15.0000 mg | ORAL_TABLET | Freq: Once | ORAL | Status: AC
  Administered 2021-05-18: 15 mg via ORAL
  Filled 2021-05-18: qty 2

## 2021-05-18 MED ORDER — MELOXICAM 15 MG PO TABS: 15.0000 mg | ORAL_TABLET | Freq: Every day | ORAL | 0 refills | Status: DC

## 2021-05-18 NOTE — ED Notes (Signed)
See triage note. Pt states numbness to middle and ring fingers beginning approx 1 month ago. Denies any specific MOI.

## 2021-05-18 NOTE — ED Provider Notes (Signed)
[facility] Emergency Department Provider Note  ____________________________________________  Time seen: Approximately 7:49 PM  I have reviewed the triage vital signs and the nursing notes.   HISTORY  Chief Complaint Numbness    HPI Heidi Acevedo is a 50 y.o. female who presents the emergency department complaining of numbness and tingling of the third and fourth fingers of the right hand.  Patient states that she works for [employer] doing the same repetitive motion over and over again.  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  She states that she has 2 parts the place and she does this roughly 800 times a night.  She is having numbness with no pain.  No injury to the hand or wrist.  Patient states that she thinks she may have carpal tunnel after talking to a family member who had same.  No medications prior to arrival.       History reviewed. No pertinent past medical history.  There are no problems to display for this patient.   Past Surgical History:  Procedure Laterality Date   ECTOPIC PREGNANCY SURGERY      Prior to Admission medications   Medication Sig Start Date End Date Taking? Authorizing Provider  meloxicam (MOBIC) 15 MG tablet Take 1 tablet (15 mg total) by mouth daily. 05/18/21  Yes Tayten Heber, Delorise Royals, PA-C  albuterol (PROVENTIL HFA;VENTOLIN HFA) 108 (90 Base) MCG/ACT inhaler Inhale 2-4 puffs by mouth every 4 hours as needed for wheezing, cough, and/or shortness of breath Patient not taking: Reported on 04/24/2019 01/17/18   Loleta Rose, MD  azithromycin (ZITHROMAX) 250 MG tablet Take 1 tablet (250 mg total) by mouth daily. Take first 2 tablets together, then 1 every day until finished. Patient not taking: Reported on 04/24/2019 06/22/13   Santiago Glad, PA-C  benzonatate (TESSALON) 100 MG capsule Take 1 capsule (100 mg total) by mouth every 8 (eight) hours. Patient not taking: Reported on 04/24/2019 06/22/13    Santiago Glad, PA-C  HYDROcodone-homatropine [facility]) 5-1.5 MG/5ML syrup Take 5 mLs by mouth every 6 (six) hours as needed for cough. Patient not taking: Reported on 04/24/2019 01/17/18   Loleta Rose, MD  ibuprofen (ADVIL,MOTRIN) 200 MG tablet Take 600 mg by mouth every 6 (six) hours as needed. For pain    [provider]  ibuprofen (ADVIL,MOTRIN) 800 MG tablet Take 1 tablet (800 mg total) by mouth every 8 (eight) hours as needed for moderate pain. Patient not taking: Reported on 04/24/2019 01/10/18   Joni Reining, PA-C  lidocaine (XYLOCAINE) 2 % solution Use as directed 5 mLs in the mouth or throat every 6 (six) hours as needed for mouth pain. Mix with Phenergan DM for swish and swallow. Patient not taking: Reported on 04/24/2019 01/10/18   Joni Reining, PA-C  methylPREDNISolone (MEDROL DOSEPAK) 4 MG TBPK tablet 6 day dose pack - take as directed 04/28/19   Felecia Shelling, DPM  metroNIDAZOLE (FLAGYL) 500 MG tablet Take 1 tablet (500 mg total) by mouth 2 (two) times daily. Patient not taking: Reported on 04/24/2019 01/24/17   Irean Hong, MD  ondansetron (ZOFRAN) 4 MG tablet Take 1 tablet (4 mg total) by mouth daily as needed for nausea or vomiting. Patient not taking: Reported on 04/24/2019 03/31/18   Jeanmarie Plant, MD  promethazine-dextromethorphan (PROMETHAZINE-DM) 6.25-15 MG/5ML syrup Take 5 mLs by mouth 4 (four) times daily as needed for cough. Mix with viscous lidocaine for swish and  swallow. Patient not taking: Reported on 04/24/2019 01/10/18   Joni Reining, PA-C    Allergies Patient has no known allergies.  No family history on file.  Social History Social History   Tobacco Use   Smoking status: Never   Smokeless tobacco: Never  Substance Use Topics   Alcohol use: No   Drug use: No     Review of Systems  Constitutional: No fever/chills Eyes: No visual changes. No discharge ENT: No upper respiratory complaints. Cardiovascular: no chest pain. Respiratory: no  cough. No SOB. Gastrointestinal: No abdominal pain.  No nausea, no vomiting.  No diarrhea.  No constipation. Musculoskeletal: Positive for numbness of the middle and ring finger to the right hand Skin: Negative for rash, abrasions, lacerations, ecchymosis. Neurological: Negative for headaches, focal weakness or numbness.  10 System ROS otherwise negative.  ____________________________________________   PHYSICAL EXAM:  VITAL SIGNS: ED Triage Vitals  Enc Vitals Group     BP 05/18/21 1748 (!) 171/99     Pulse Rate 05/18/21 1748 84     Resp 05/18/21 1748 18     Temp 05/18/21 1748 98.3 F (36.8 C)     Temp Source 05/18/21 1748 Oral     SpO2 05/18/21 1748 98 %     Weight 05/18/21 1755 279 lb 15.8 oz (127 kg)     Height 05/18/21 1755 5\' 9"  (1.753 m)     Head Circumference --      Peak Flow --      Pain Score 05/18/21 1755 0     Pain Loc --      Pain Edu? --      Excl. in GC? --      Constitutional: Alert and oriented. Well appearing and in no acute distress. Eyes: Conjunctivae are normal. PERRL. EOMI. Head: Atraumatic. ENT:      Ears:       Nose: No congestion/rhinnorhea.      Mouth/Throat: Mucous membranes are moist.  Neck: No stridor.    Cardiovascular: Normal rate, regular rhythm. Normal S1 and S2.  Good peripheral circulation. Respiratory: Normal respiratory effort without tachypnea or retractions. Lungs CTAB. Good air entry to the bases with no decreased or absent breath sounds. Musculoskeletal: Full range of motion to all extremities. No gross deformities appreciated.  Visualization of the right hand reveals no visible abnormality.  No erythema, edema, ecchymosis.  Full range of motion to the wrist and all digits right hand.  Patient has decreased sensation to the third and fourth digits but no loss of range of motion.  Capillary refill intact all digits. Neurologic:  Normal speech and language. No gross focal neurologic deficits are appreciated.  Skin:  Skin is warm, dry  and intact. No rash noted. Psychiatric: Mood and affect are normal. Speech and behavior are normal. Patient exhibits appropriate insight and judgement.   ____________________________________________   LABS (all labs ordered are listed, but only abnormal results are displayed)  Labs Reviewed - No data to display ____________________________________________  EKG   ____________________________________________  RADIOLOGY   No results found.  ____________________________________________    PROCEDURES  Procedure(s) performed:    Procedures    Medications  meloxicam (MOBIC) tablet 15 mg (has no administration in time range)     ____________________________________________   INITIAL IMPRESSION / ASSESSMENT AND PLAN / ED COURSE  Pertinent labs & imaging results that were available during my care of the patient were reviewed by me and considered in my medical decision making (see chart for  details).  Review of the [place] CSRS was performed in accordance of the [facility] prior to dispensing any controlled drugs.           Patient's diagnosis is consistent with carpal tunnel syndrome.  Patient presents with numbness of the middle and ring finger of the right hand.  She does repetitive motions at work every shift.  At this time I feel that symptoms are consistent with carpal tunnel given the repetitive nature of her work and the numbness in the distribution.  Patiently placed on anti-inflammatory and given a Velcro wrist brace.  Follow-up with Ortho if symptoms or not improving. Patient is given ED precautions to return to the ED for any worsening or new symptoms.     ____________________________________________  FINAL CLINICAL IMPRESSION(S) / ED DIAGNOSES  Final diagnoses:  Carpal tunnel syndrome of right wrist      NEW MEDICATIONS STARTED DURING THIS VISIT:  ED Discharge Orders          Ordered    meloxicam (MOBIC) 15 MG tablet  Daily        05/18/21 2012                 This chart was dictated using voice recognition software/Dragon. Despite best efforts to proofread, errors can occur which can change the meaning. Any change was purely unintentional.    Lanette Hampshire 05/18/21 2013    Concha Se, MD 05/19/21 [phone]

## 2021-05-18 NOTE — ED Triage Notes (Signed)
Pt reports for over a month now her right hand middle finger tip feels numb. Pt denies injuries or other sx's.

## 2021-07-20 ENCOUNTER — Emergency Department
Admission: EM | Admit: 2021-07-20 | Discharge: 2021-07-20 | Disposition: A | Discharge status: Home / Self Care | Payer: PRIVATE HEALTH INSURANCE | Attending: Emergency Medicine | Admitting: Emergency Medicine

## 2021-07-20 ENCOUNTER — Other Ambulatory Visit: Payer: Self-pay

## 2021-07-20 ENCOUNTER — Emergency Department: Payer: PRIVATE HEALTH INSURANCE

## 2021-07-20 ENCOUNTER — Encounter: Payer: Self-pay | Admitting: Emergency Medicine

## 2021-07-20 DIAGNOSIS — R0789 Other chest pain: Secondary | ICD-10-CM | POA: Insufficient documentation

## 2021-07-20 DIAGNOSIS — O0289 Other abnormal products of conception: Secondary | ICD-10-CM | POA: Insufficient documentation

## 2021-07-20 DIAGNOSIS — R109 Unspecified abdominal pain: Secondary | ICD-10-CM | POA: Insufficient documentation

## 2021-07-20 DIAGNOSIS — R110 Nausea: Secondary | ICD-10-CM | POA: Insufficient documentation

## 2021-07-20 LAB — BASIC METABOLIC PANEL
Anion gap: 6 (ref 5–15)
BUN: 15 mg/dL (ref 6–20)
CO2: 25 mmol/L (ref 22–32)
Calcium: 9 mg/dL (ref 8.9–10.3)
Chloride: 103 mmol/L (ref 98–111)
Creatinine, Ser: 0.85 mg/dL (ref 0.44–1.00)
GFR, Estimated: >60 mL/min (ref >60)
Glucose, Bld: 156 mg/dL — ABNORMAL HIGH (ref 70–99)
Potassium: 4.3 mmol/L (ref 3.5–5.1)
Sodium: 134 mmol/L — ABNORMAL LOW (ref 135–145)

## 2021-07-20 LAB — CBC
HCT: 38.7 % (ref 36.0–46.0)
Hemoglobin: 12.7 g/dL (ref 12.0–15.0)
MCH: 29.4 pg (ref 26.0–34.0)
MCHC: 32.8 g/dL (ref 30.0–36.0)
MCV: 89.6 fL (ref 80.0–100.0)
Platelets: 391 10*3/uL (ref 150–400)
RBC: 4.32 MIL/uL (ref 3.87–5.11)
RDW: 13.8 % (ref 11.5–15.5)
WBC: 12.7 10*3/uL — ABNORMAL HIGH (ref 4.0–10.5)
nRBC: 0.2 % (ref 0.0–0.2)

## 2021-07-20 LAB — HCG, QUANTITATIVE, PREGNANCY: hCG, Beta Chain, Quant, S: 1 m[IU]/mL (ref <5)

## 2021-07-20 LAB — TROPONIN I (HIGH SENSITIVITY)
Troponin I (High Sensitivity): 5 ng/L (ref <18)
Troponin I (High Sensitivity): 5 ng/L (ref <18)

## 2021-07-20 MED ORDER — KETOROLAC TROMETHAMINE 30 MG/ML IJ SOLN
15.0000 mg | Freq: Once | INTRAMUSCULAR | Status: AC
  Administered 2021-07-20: 15 mg via INTRAVENOUS

## 2021-07-20 MED ORDER — IOHEXOL 350 MG/ML SOLN
100.0000 mL | Freq: Once | INTRAVENOUS | Status: AC | PRN
  Administered 2021-07-20: 100 mL via INTRAVENOUS
  Filled 2021-07-20: qty 100

## 2021-07-20 MED ORDER — DROPERIDOL 2.5 MG/ML IJ SOLN
2.5000 mg | Freq: Once | INTRAMUSCULAR | Status: AC
  Administered 2021-07-20: 2.5 mg via INTRAVENOUS
  Filled 2021-07-20: qty 2

## 2021-07-20 MED ORDER — DIPHENHYDRAMINE HCL 50 MG/ML IJ SOLN
12.5000 mg | INTRAMUSCULAR | Status: AC
  Administered 2021-07-20: 12.5 mg via INTRAVENOUS
  Filled 2021-07-20: qty 1

## 2021-07-20 NOTE — Discharge Instructions (Addendum)

## 2021-07-20 NOTE — ED Provider Notes (Signed)
[facility] Emergency Department Provider Note  ____________________________________________   Event Date/Time   First MD Initiated Contact with Patient 07/20/21 [phone]     (approximate)  I have reviewed the triage vital signs and the nursing notes.   HISTORY  Chief Complaint Chest Pain    HPI Heidi Acevedo is a 50 y.o. female with no contributory past medical history but who has had multiple visits for various pain complaints in the past.  She presents tonight with acute onset and severe pain that she states feels like someone is punching right straight through her from her back to her chest or vice versa.  It occurred while she was at work.  It is in the middle of her upper back and the middle of her chest.  It hurts to take deep breaths and that she says the pain sometimes radiates into her abdomen.  She is also reporting pain that radiates down the back of her left leg.  No other back pain.  No history of recent trauma or injury.  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  It was acute in onset, sharp, and severe, nothing in particular makes it better or worse.  No weakness in her extremities.  She has had some  nausea but no vomiting.  She was given a full dose aspirin in route by EMS.     History reviewed. No pertinent past medical history.  There are no problems to display for this patient.   Past Surgical History:  Procedure Laterality Date   ECTOPIC PREGNANCY SURGERY      Prior to Admission medications   Medication Sig Start Date End Date Taking? Authorizing Provider  albuterol (PROVENTIL HFA;VENTOLIN HFA) 108 (90 Base) MCG/ACT inhaler Inhale 2-4 puffs by mouth every 4 hours as needed for wheezing, cough, and/or shortness of breath Patient not taking: Reported on 04/24/2019 01/17/18   Loleta Rose, MD  azithromycin (ZITHROMAX) 250 MG tablet Take 1 tablet (250  mg total) by mouth daily. Take first 2 tablets together, then 1 every day until finished. Patient not taking: Reported on 04/24/2019 06/22/13   Santiago Glad, PA-C  benzonatate (TESSALON) 100 MG capsule Take 1 capsule (100 mg total) by mouth every 8 (eight) hours. Patient not taking: Reported on 04/24/2019 06/22/13   Santiago Glad, PA-C  HYDROcodone-homatropine [place]) 5-1.5 MG/5ML syrup Take 5 mLs by mouth every 6 (six) hours as needed for cough. Patient not taking: Reported on 04/24/2019 01/17/18   Loleta Rose, MD  ibuprofen (ADVIL,MOTRIN) 200 MG tablet Take 600 mg by mouth every 6 (six) hours as needed. For pain    [provider]  ibuprofen (ADVIL,MOTRIN) 800 MG tablet Take 1 tablet (800 mg total) by mouth every 8 (eight) hours as needed for moderate pain. Patient not taking: Reported on 04/24/2019 01/10/18   Joni Reining, PA-C  lidocaine (XYLOCAINE) 2 % solution Use as directed 5 mLs in the mouth or throat every 6 (six) hours as needed for mouth pain. Mix with Phenergan DM for swish and swallow. Patient not taking: Reported on 04/24/2019 01/10/18   Joni Reining, PA-C  meloxicam (MOBIC) 15 MG tablet Take 1 tablet (15 mg total) by mouth daily. 05/18/21   Cuthriell, Delorise Royals, PA-C  methylPREDNISolone (MEDROL DOSEPAK) 4 MG TBPK tablet 6 day dose pack - take as directed 04/28/19   Felecia Shelling, DPM  metroNIDAZOLE (FLAGYL)  500 MG tablet Take 1 tablet (500 mg total) by mouth 2 (two) times daily. Patient not taking: Reported on 04/24/2019 01/24/17   Irean Hong, MD  ondansetron (ZOFRAN) 4 MG tablet Take 1 tablet (4 mg total) by mouth daily as needed for nausea or vomiting. Patient not taking: Reported on 04/24/2019 03/31/18   Jeanmarie Plant, MD  promethazine-dextromethorphan (PROMETHAZINE-DM) 6.25-15 MG/5ML syrup Take 5 mLs by mouth 4 (four) times daily as needed for cough. Mix with viscous lidocaine for swish and swallow. Patient not taking: Reported on 04/24/2019 01/10/18   Joni Reining, PA-C    Allergies Patient has no known allergies.  History reviewed. No pertinent family history.  Social History Social History   Tobacco Use   Smoking status: Never   Smokeless tobacco: Never  Substance Use Topics   Alcohol use: No   Drug use: No    Review of Systems Constitutional: No fever/chills Eyes: No visual changes. ENT: No sore throat. Cardiovascular: Positive for chest pain through to the back or vice versa Respiratory: Denies shortness of breath. Gastrointestinal: Positive for some pain radiating into into her abdomen from her chest..  Nausea, no vomiting.  No diarrhea.  No constipation. Genitourinary: Negative for dysuria. Musculoskeletal: Severe upper back pain radiating through to the chest.  Some pain radiating down the left leg intermittently. Integumentary: Negative for rash. Neurological: Negative for headaches, focal weakness or numbness.   ____________________________________________   PHYSICAL EXAM:  VITAL SIGNS: ED Triage Vitals  Enc Vitals Group     BP 07/20/21 0215 137/89     Pulse Rate 07/20/21 0215 90     Resp 07/20/21 0215 16     Temp 07/20/21 0215 98.6 F (37 C)     Temp Source 07/20/21 0215 Oral     SpO2 07/20/21 0215 99 %     Weight 07/20/21 0213 127 kg (280 lb)     Height 07/20/21 0213 1.753 m (5\' 9" )     Head Circumference --      Peak Flow --      Pain Score 07/20/21 0212 9     Pain Loc --      Pain Edu? --      Excl. in GC? --     Constitutional: Alert and oriented.  Contributes minimally to exam and history because she says it is hurting. Eyes: Conjunctivae are normal.  Head: Atraumatic. Nose: No congestion/rhinnorhea. Mouth/Throat: Patient is wearing a mask. Neck: No stridor.  No meningeal signs.   Cardiovascular: Normal rate, regular rhythm. Good peripheral circulation. Respiratory: Normal respiratory effort.  No retractions. Gastrointestinal: Obese.  Soft and nontender. No distention.  Musculoskeletal:  Highly reproducible central chest wall tenderness to palpation. Neurologic: Mumbled speech and language. No gross focal neurologic deficits are appreciated.  Skin:  Skin is warm, dry and intact.   ____________________________________________   LABS (all labs ordered are listed, but only abnormal results are displayed)  Labs Reviewed  BASIC METABOLIC PANEL - Abnormal; Notable for the following components:      Result Value   Sodium 134 (*)    Glucose, Bld 156 (*)    All other components within normal limits  CBC - Abnormal; Notable for the following components:   WBC 12.7 (*)    All other components within normal limits  HCG, QUANTITATIVE, PREGNANCY  TROPONIN I (HIGH SENSITIVITY)  TROPONIN I (HIGH SENSITIVITY)   ____________________________________________  EKG  ED ECG REPORT I, 07/22/21, the attending physician, personally viewed  and interpreted this ECG.  Date: 07/20/2021 EKG Time: 2:10 AM Rate: 100 Rhythm: Borderline sinus tachycardia QRS Axis: normal Intervals: normal ST/T Wave abnormalities: normal Narrative Interpretation: no evidence of acute ischemia  ____________________________________________  RADIOLOGY I, Loleta Rose, personally viewed and evaluated these images (plain radiographs) as part of my medical decision making, as well as reviewing the written report by the radiologist.  ED MD interpretation: No acute abnormalities on chest x-ray. CTA chest/abd/pelvis shows no evidence of an emergent medical condition.  Official radiology report(s): DG Chest 2 View  Result Date: 07/20/2021 CLINICAL DATA:  Chest pain. EXAM: CHEST - 2 VIEW COMPARISON:  Chest radiograph dated 01/17/2018 FINDINGS: The heart size and mediastinal contours are within normal limits. Both lungs are clear. The visualized skeletal structures are unremarkable. IMPRESSION: No active cardiopulmonary disease. Electronically Signed   By: Elgie Collard M.D.   On: 07/20/2021 02:45   CT  Angio Chest/Abd/Pel for Dissection W and/or W/WO  Result Date: 07/20/2021 CLINICAL DATA:  Acute onset chest pain radiating to the back and abdomen EXAM: CT ANGIOGRAPHY CHEST, ABDOMEN AND PELVIS TECHNIQUE: Non-contrast CT of the chest was initially obtained. Multidetector CT imaging through the chest, abdomen and pelvis was performed using the standard protocol during bolus administration of intravenous contrast. Multiplanar reconstructed images and MIPs were obtained and reviewed to evaluate the vascular anatomy. CONTRAST:  OMNIPAQUE IOHEXOL 350 MG/ML SOLN COMPARISON:  08/26/2017 FINDINGS: CTA CHEST FINDINGS Cardiovascular: Initial noncontrast imaging of the chest demonstrates no hyperdense aortic intramural hematoma. No mediastinal hemorrhage or hematoma. Normal heart size. No pericardial effusion. Post-contrast, there is limitation related to motion artifact. Thoracic aorta appears intact without acute dissection or aneurysm. Patent bovine arch anatomy. Central pulmonary arteries appear patent. Central venous structures are also patent. No veno-occlusive process. Mediastinum/Nodes: No enlarged mediastinal, hilar, or axillary lymph nodes. Thyroid gland, trachea, and esophagus demonstrate no significant findings. Lungs/Pleura: Lungs are clear. No pleural effusion or pneumothorax. Musculoskeletal: No chest wall abnormality. No acute or significant osseous findings. Review of the MIP images confirms the above findings. CTA ABDOMEN AND PELVIS FINDINGS VASCULAR Aorta: Intact abdominal aorta. Negative for aneurysm or dissection. No acute vascular process. Celiac: Remains patent including its branches SMA: Remains patent including its branches Renals: Both main renal arteries are widely patent IMA: Remains patent off the distal aorta Inflow: Pelvic iliac vasculature widely patent. No inflow disease or occlusion. No acute vascular process. Veins: Dedicated venous phase imaging not performed Review of the MIP images  confirms the above findings. NON-VASCULAR Hepatobiliary: Hepatic steatosis and hepatomegaly. Liver measures 20 cm in length. No focal hepatic abnormality or intrahepatic biliary dilatation. Gallbladder nondistended. Common bile duct nondilated. Pancreas: Unremarkable. No pancreatic ductal dilatation or surrounding inflammatory changes. Spleen: Normal in size without focal abnormality. Adrenals/Urinary Tract: Adrenal glands are unremarkable. Kidneys are normal, without renal calculi, focal lesion, or hydronephrosis. Bladder is unremarkable. Stomach/Bowel: Stomach is within normal limits. Appendix appears normal. No evidence of bowel wall thickening, distention, or inflammatory changes. Lymphatic: No bulky adenopathy. Reproductive: Uterus and bilateral adnexa are unremarkable. Other: Anterior abdominal wall diastasis noted. No definite hernia. No ascites. Musculoskeletal: Degenerative changes throughout the spine, most pronounced at L5-S1. No acute osseous finding. Review of the MIP images confirms the above findings. IMPRESSION: Intact aorta. Negative for aneurysm or dissection. No acute vascular process. No other acute intrathoracic, abdominal or pelvic finding by CT. Hepatic steatosis and hepatomegaly Electronically Signed   By: Judie Petit.  Shick M.D.   On: 07/20/2021 07:21  ____________________________________________   PROCEDURES   Procedure(s) performed (including Critical Care):  .1-3 Lead EKG Interpretation Performed by: Loleta Rose, MD Authorized by: Loleta Rose, MD     Interpretation: normal     ECG rate:  99   ECG rate assessment: normal     Rhythm: sinus rhythm     Ectopy: none     Conduction: normal     ____________________________________________   INITIAL IMPRESSION / MDM / ASSESSMENT AND PLAN / ED COURSE  As part of my medical decision making, I reviewed the following data within the electronic MEDICAL RECORD NUMBER History obtained from family, Nursing notes reviewed and  incorporated, Labs reviewed , EKG interpreted , Old chart reviewed, Notes from prior ED visits, and [place] Controlled Substance Database   Differential diagnosis includes, but is not limited to, musculoskeletal pain/strain, pneumonia, PE, dissection, ACS.  The patient is on the cardiac monitor to evaluate for evidence of arrhythmia and/or significant heart rate changes.  No evidence of ischemia on EKG but the patient does have borderline tachycardia.  I personally reviewed the patient's imaging and agree with the radiologist's interpretation that there are no acute abnormalities on chest x-ray.  Initial high-sensitivity troponin is normal.  Basic metabolic panel and CBC are essentially normal other than a mild leukocytosis.  Her vital signs are stable and she is not substantially hypertensive nor does she have any evidence of hypotension.  Her chest wall pain is highly reproducible to palpation.  However, she is describing severe and constant pain in her chest and back that going straight through to recheck other with some abdominal pain and some possible neurological symptoms or pain radiating down her left leg.  Given the constellation of symptoms she is describing, and given the morbidity mortality of a missed AAS diagnosis, I feel it is necessary and appropriate to obtain a CTA chest/abdomen/pelvis to rule out aortic dissection/aneurysm.  Patient has not provided a urine specimen so I we will add on an hCG.  For the patient's discomfort including pain and nausea, I am providing droperidol 2.5 mg IV and Benadryl 12.5 mg IV to mitigate the possibility of akathisia side effects of the droperidol.     Clinical Course as of 07/20/21 0758  Thu Jul 20, 2021  0621 HCG, Clement Sayres, S: 1 [CF]  [phone] Troponin I (High Sensitivity): 5 [CF]  0732 CT Angio Chest/Abd/Pel for Dissection W and/or W/WO Normal CTA chest/abdomen/pelvis.  I will reassess the patient but anticipate discharge. [CF]  [phone] Patient  has been sleeping heavily.  He woke up easily when I talked to her and said that she feels much better.  I updated her and her husband about the results and they are comfortable with the plan for discharge.  I encouraged use of over-the-counter analgesics and they said they understand and agree with the plan.  I gave my usual and customary return precautions [CF]  [phone] We will also give 50 mg of IV Toradol prior to discharge [CF]    Clinical Course User Index [CF] Loleta Rose, MD     ____________________________________________  FINAL CLINICAL IMPRESSION(S) / ED DIAGNOSES  Final diagnoses:  Chest wall pain     MEDICATIONS GIVEN DURING THIS VISIT:  Medications  droperidol (INAPSINE) 2.5 MG/ML injection 2.5 mg (2.5 mg Intravenous Given 07/20/21 0543)  diphenhydrAMINE (BENADRYL) injection 12.5 mg (12.5 mg Intravenous Given 07/20/21 0543)  iohexol (OMNIPAQUE) 350 MG/ML injection 100 mL (100 mLs Intravenous Contrast Given 07/20/21 0631)  ketorolac (TORADOL) 30  MG/ML injection 15 mg (15 mg Intravenous Given 07/20/21 0749)     ED Discharge Orders     None        Note:  This document was prepared using Dragon voice recognition software and may include unintentional dictation errors.   Loleta Rose, MD 07/20/21 [phone]

## 2021-07-20 NOTE — ED Triage Notes (Signed)
Pt to ED via EMS from home c/o mid chest pain radiating to back that started suddenly at midnight tonight without injury.  Pain reproducible with palpation.  Denies n/v/d or SOB.  Given 324mg  ASA en route via EMS.  Pt A&Ox4, chest rise even and unlabored, skin color WNL, in NAD at this time.

## 2021-08-01 ENCOUNTER — Emergency Department
Admission: EM | Admit: 2021-08-01 | Discharge: 2021-08-01 | Disposition: A | Discharge status: Home / Self Care | Payer: Disability Insurance | Attending: Emergency Medicine | Admitting: Emergency Medicine

## 2021-08-01 ENCOUNTER — Other Ambulatory Visit: Payer: Self-pay

## 2021-08-01 ENCOUNTER — Emergency Department: Payer: Disability Insurance

## 2021-08-01 DIAGNOSIS — T1490XA Injury, unspecified, initial encounter: Secondary | ICD-10-CM

## 2021-08-01 DIAGNOSIS — M2141 Flat foot [pes planus] (acquired), right foot: Secondary | ICD-10-CM

## 2021-08-01 DIAGNOSIS — X501XXA Overexertion from prolonged static or awkward postures, initial encounter: Secondary | ICD-10-CM | POA: Insufficient documentation

## 2021-08-01 DIAGNOSIS — M7750 Other enthesopathy of unspecified foot: Secondary | ICD-10-CM

## 2021-08-01 DIAGNOSIS — S99912A Unspecified injury of left ankle, initial encounter: Secondary | ICD-10-CM | POA: Insufficient documentation

## 2021-08-01 DIAGNOSIS — Y990 Civilian activity done for income or pay: Secondary | ICD-10-CM | POA: Insufficient documentation

## 2021-08-01 MED ORDER — MELOXICAM 15 MG PO TABS: 15.0000 mg | ORAL_TABLET | Freq: Every day | ORAL | 0 refills | Status: DC

## 2021-08-01 NOTE — ED Provider Notes (Signed)
[facility] Emergency Department Provider Note  ____________________________________________  Time seen: Approximately 8:27 PM  I have reviewed the triage vital signs and the nursing notes.   HISTORY  Chief Complaint Foot Injury    HPI Heidi Acevedo is a 50 y.o. female who presents emergency department complaining of anterior ankle pain.  Patient states that she stands all day on her feet, and is having pain along the anterior ankle without specific injury.  Patient has had issues with the left foot with heel spurs but states that this pain is on the front part of her ankle.  No erythema.  No warmth to palpation.  No other injury or complaint.       History reviewed. No pertinent past medical history.  There are no problems to display for this patient.   Past Surgical History:  Procedure Laterality Date   ECTOPIC PREGNANCY SURGERY      Prior to Admission medications   Medication Sig Start Date End Date Taking? Authorizing Provider  albuterol (PROVENTIL HFA;VENTOLIN HFA) 108 (90 Base) MCG/ACT inhaler Inhale 2-4 puffs by mouth every 4 hours as needed for wheezing, cough, and/or shortness of breath Patient not taking: Reported on 04/24/2019 01/17/18   Loleta Rose, MD  azithromycin (ZITHROMAX) 250 MG tablet Take 1 tablet (250 mg total) by mouth daily. Take first 2 tablets together, then 1 every day until finished. Patient not taking: Reported on 04/24/2019 06/22/13   Santiago Glad, PA-C  benzonatate (TESSALON) 100 MG capsule Take 1 capsule (100 mg total) by mouth every 8 (eight) hours. Patient not taking: Reported on 04/24/2019 06/22/13   Santiago Glad, PA-C  HYDROcodone-homatropine [facility]) 5-1.5 MG/5ML syrup Take 5 mLs by mouth every 6 (six) hours as needed for cough. Patient not taking: Reported on 04/24/2019 01/17/18   Loleta Rose, MD  ibuprofen (ADVIL,MOTRIN) 200 MG tablet Take 600 mg by mouth every 6 (six) hours as needed. For pain    [provider]  ibuprofen (ADVIL,MOTRIN) 800 MG tablet Take 1 tablet (800 mg total) by mouth every 8 (eight) hours as needed for moderate pain. Patient not taking: Reported on 04/24/2019 01/10/18   Joni Reining, PA-C  lidocaine (XYLOCAINE) 2 % solution Use as directed 5 mLs in the mouth or throat every 6 (six) hours as needed for mouth pain. Mix with Phenergan DM for swish and swallow. Patient not taking: Reported on 04/24/2019 01/10/18   Joni Reining, PA-C  meloxicam (MOBIC) 15 MG tablet Take 1 tablet (15 mg total) by mouth daily. 05/18/21   Arran Fessel, Delorise Royals, PA-C  methylPREDNISolone (MEDROL DOSEPAK) 4 MG TBPK tablet 6 day dose pack - take as directed 04/28/19   Felecia Shelling, DPM  metroNIDAZOLE (FLAGYL) 500 MG tablet Take 1 tablet (500 mg total) by mouth 2 (two) times daily. Patient not taking: Reported on 04/24/2019 01/24/17   Irean Hong, MD  ondansetron (ZOFRAN) 4 MG tablet Take 1 tablet (4 mg total) by mouth daily as needed for nausea or vomiting. Patient not taking: Reported on 04/24/2019 03/31/18   Jeanmarie Plant, MD  promethazine-dextromethorphan (PROMETHAZINE-DM) 6.25-15 MG/5ML syrup Take 5 mLs by mouth 4 (four) times daily as needed for cough. Mix with viscous lidocaine for swish and swallow. Patient not taking: Reported on 04/24/2019 01/10/18   Joni Reining, PA-C    Allergies Patient has no known allergies.  No family history on file.  Social History Social History   Tobacco Use   Smoking status: Never  Smokeless tobacco: Never  Substance Use Topics   Alcohol use: No   Drug use: No     Review of Systems  Constitutional: No fever/chills Eyes: No visual changes. No discharge ENT: No upper respiratory complaints. Cardiovascular: no chest pain. Respiratory: no cough. No SOB. Gastrointestinal: No abdominal pain.  No nausea, no vomiting.  No diarrhea.  No constipation. Musculoskeletal: Pain to the anterior joint, none traumatic Skin: Negative for rash, abrasions,  lacerations, ecchymosis. Neurological: Negative for headaches, focal weakness or numbness.  10 System ROS otherwise negative.  ____________________________________________   PHYSICAL EXAM:  VITAL SIGNS: ED Triage Vitals  Enc Vitals Group     BP 08/01/21 1841 (!) 152/141     Pulse Rate 08/01/21 1841 74     Resp 08/01/21 1841 16     Temp 08/01/21 1841 97.9 F (36.6 C)     Temp Source 08/01/21 1841 Oral     SpO2 08/01/21 1841 98 %     Weight 08/01/21 1841 280 lb (127 kg)     Height 08/01/21 1841 5\' 9"  (1.753 m)     Head Circumference --      Peak Flow --      Pain Score 08/01/21 1841 10     Pain Loc --      Pain Edu? --      Excl. in GC? --      Constitutional: Alert and oriented. Well appearing and in no acute distress. Eyes: Conjunctivae are normal. PERRL. EOMI. Head: Atraumatic. ENT:      Ears:       Nose: No congestion/rhinnorhea.      Mouth/Throat: Mucous membranes are moist.  Neck: No stridor.    Cardiovascular: Normal rate, regular rhythm. Normal S1 and S2.  Good peripheral circulation. Respiratory: Normal respiratory effort without tachypnea or retractions. Lungs CTAB. Good air entry to the bases with no decreased or absent breath sounds. Musculoskeletal: Full range of motion to all extremities. No gross deformities appreciated.  Visualization of the ankle reveals some mild soft tissue edema along the anterior joint line.  There is no erythema.  No warmth to palpation.  Patient is tender over the anterior talofibular ligament distribution.  No palpable abnormality or deficit.  No tenderness.  Patient does have significant pes planus bilaterally.  Sensation and capillary refill intact all digits. Neurologic:  Normal speech and language. No gross focal neurologic deficits are appreciated.  Skin:  Skin is warm, dry and intact. No rash noted. Psychiatric: Mood and affect are normal. Speech and behavior are normal. Patient exhibits appropriate insight and  judgement.   ____________________________________________   LABS (all labs ordered are listed, but only abnormal results are displayed)  Labs Reviewed - No data to display ____________________________________________  EKG   ____________________________________________  RADIOLOGY I personally viewed and evaluated these images as part of my medical decision making, as well as reviewing the written report by the radiologist.  ED Provider Interpretation: Incidental finding of what appears to be a very new opaque retained foreign body.  No recent injuries, no reports of retained foreign body to the foot.  Mild subcutaneous tissue edema about the ankle.  No other obvious osseous abnormality to the ankle joint  DG Foot Complete Right  Result Date: 08/01/2021 CLINICAL DATA:  ER via POV with complaints of right foot pain that she woke up with. Reports that she works at [employer] 3rd shift, is on her feet all night, and wears steel toed boots. EXAM: RIGHT FOOT COMPLETE -  3+ VIEW COMPARISON:  None. FINDINGS: There is no evidence of fracture or dislocation. There is no evidence of arthropathy or other focal bone abnormality. Soft tissues are unremarkable. Posterior and plantar calcaneal spurs. Couple triangular 2-3 mm radiopaque densities embedded within the plantar soft tissues at the level of the base of the fifth proximal phalanx. Associated subcutaneus soft tissue edema. Subcutaneus soft tissue edema of the ankle. IMPRESSION: Couple triangular 2-3 mm radiopaque densities embedded within the plantar soft tissues at the level of the base of the fifth proximal phalanx. Electronically Signed   By: Tish Frederickson M.D.   On: 08/01/2021 19:28    ____________________________________________    PROCEDURES  Procedure(s) performed:    Procedures    Medications - No data to display   ____________________________________________   INITIAL IMPRESSION / ASSESSMENT AND PLAN / ED COURSE  Pertinent  labs & imaging results that were available during my care of the patient were reviewed by me and considered in my medical decision making (see chart for details).  Review of the [place] CSRS was performed in accordance of the [facility] prior to dispensing any controlled drugs.           Patient's diagnosis is consistent with tendinitis, pes planus.  Patient presents emergency department complaining of anterior joint pain.  Patient stands for prolonged periods of time on the concrete at work.  She has significant pes planus.  Tender along the anterior joint line.  There is no erythema or warmth to suggest underlying infection.  Patient does have issues with tendinitis and I suspect same as it is over the anterior talofibular ligament distribution.  No reported injury.  Patient should use gel inserts, change her shoes regularly.  She states that she will follow-up with podiatry to discuss arch support/orthotics.  Patient will be started on anti-inflammatories at this time.  Concerning signs and symptoms are discussed with the patient.  Follow-up with podiatry. Patient is given ED precautions to return to the ED for any worsening or new symptoms.     ____________________________________________  FINAL CLINICAL IMPRESSION(S) / ED DIAGNOSES  Final diagnoses:  Injury      NEW MEDICATIONS STARTED DURING THIS VISIT:  ED Discharge Orders     None           This chart was dictated using voice recognition software/Dragon. Despite best efforts to proofread, errors can occur which can change the meaning. Any change was purely unintentional.    Racheal Patches, PA-C 08/01/21 2050    Merwyn Katos, MD 08/02/21 1329

## 2021-08-01 NOTE — Discharge Instructions (Signed)
You have a condition called pes planus which is a medical term for flatfeet.  You also have tendinitis along the tendons of her ankle.

## 2021-08-01 NOTE — ED Triage Notes (Signed)
Pt to ER via POV with complaints of right foot pain that she woke up with. Reports that she works at [employer] 3rd shift, is on her feet all night, and wears steel toed boots. Denies injury. Reports pain is present across the top/ arch area to her foot. Has been told she has a heel spur to the left foot but has never had issues with her right foot.

## 2021-08-01 NOTE — ED Notes (Signed)
Pt sitting up in bed on phone laughing at time of discharge. Pt states pain is 10/10. Pt wheeled to waiting room for transport from husband. Denies any questions or concerns

## 2021-11-21 ENCOUNTER — Encounter: Payer: Self-pay | Admitting: Emergency Medicine

## 2021-11-21 ENCOUNTER — Other Ambulatory Visit: Payer: Self-pay

## 2021-11-21 DIAGNOSIS — S161XXA Strain of muscle, fascia and tendon at neck level, initial encounter: Secondary | ICD-10-CM | POA: Diagnosis present

## 2021-11-21 DIAGNOSIS — Y92410 Unspecified street and highway as the place of occurrence of the external cause: Secondary | ICD-10-CM | POA: Diagnosis not present
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
## 2021-11-22 ENCOUNTER — Emergency Department
Admission: EM | Admit: 2021-11-22 | Discharge: 2021-11-22 | Disposition: A | Discharge status: Home / Self Care | Payer: No Typology Code available for payment source | Attending: Emergency Medicine | Admitting: Emergency Medicine

## 2021-11-22 ENCOUNTER — Emergency Department: Payer: No Typology Code available for payment source

## 2021-11-22 DIAGNOSIS — S161XXA Strain of muscle, fascia and tendon at neck level, initial encounter: Secondary | ICD-10-CM
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
## 2021-11-22 NOTE — Discharge Instructions (Addendum)

## 2021-11-22 NOTE — ED Notes (Signed)
Pt gives verbal consent for discharge

## 2022-01-01 ENCOUNTER — Other Ambulatory Visit: Payer: Self-pay

## 2022-01-01 ENCOUNTER — Emergency Department
Admission: EM | Admit: 2022-01-01 | Discharge: 2022-01-01 | Disposition: A | Discharge status: Home / Self Care | Payer: Disability Insurance | Attending: Student in an Organized Health Care Education/Training Program | Admitting: Student in an Organized Health Care Education/Training Program

## 2022-01-01 ENCOUNTER — Encounter: Payer: Self-pay | Admitting: *Deleted

## 2022-01-01 ENCOUNTER — Emergency Department: Payer: Disability Insurance

## 2022-01-01 DIAGNOSIS — M25572 Pain in left ankle and joints of left foot: Secondary | ICD-10-CM | POA: Insufficient documentation

## 2022-01-01 DIAGNOSIS — R21 Rash and other nonspecific skin eruption: Secondary | ICD-10-CM | POA: Insufficient documentation

## 2022-01-01 MED ORDER — MELOXICAM 15 MG PO TABS: 15.0000 mg | ORAL_TABLET | Freq: Every day | ORAL | 0 refills | Status: DC

## 2022-01-01 NOTE — ED Triage Notes (Signed)
Pt has left ankle pain.  No known injury.  No swelling  pt has diff walking.  Pt alert.   ?

## 2022-01-01 NOTE — ED Provider Notes (Signed)
? ?[facility] ?Provider Note ? ? ? Event Date/Time  ? First MD Initiated Contact with Patient 01/01/22 1800   ?  (approximate) ? ? ?History  ? ?Ankle Pain ? ? ?HPI ? ?Heidi Acevedo is a 51 y.o. female with no chronic medical history and as listed in EMR presents to the emergency department for treatment and evaluation of sudden onset left ankle pain.  No specific injury.  This is different than joint pain that she is experienced in the past.  Pain increases with attempt to bear weight.  No alleviating measures attempted prior to arrival.. ? ?  ? ? ?Physical Exam  ? ?Triage Vital Signs: ?ED Triage Vitals  ?Enc Vitals Group  ?   BP 01/01/22 1745 140/64  ?   Pulse Rate 01/01/22 1745 78  ?   Resp 01/01/22 1745 18  ?   Temp 01/01/22 1745 98.2 ?F (36.8 ?C)  ?   Temp Source 01/01/22 1745 Oral  ?   SpO2 01/01/22 1745 96 %  ?   Weight 01/01/22 1741 280 lb (127 kg)  ?   Height 01/01/22 1741 5\' 9"  (1.753 m)  ?   Head Circumference --   ?   Peak Flow --   ?   Pain Score 01/01/22 1741 3  ?   Pain Loc --   ?   Pain Edu? --   ?   Excl. in GC? --   ? ? ?Most recent vital signs: ?Vitals:  ? 01/01/22 1745 01/01/22 1846  ?BP: 140/64 (!) 147/99  ?Pulse: 78 77  ?Resp: 18 20  ?Temp: 98.2 ?F (36.8 ?C)   ?SpO2: 96% 97%  ? ? ?General: Awake, no distress.  ?CV:  Good peripheral perfusion.  ?Resp:  Normal effort.  ?Abd:  No distention.  ?Other:              Focal tenderness over the's skin surface of the talar dome of the left ankle.  No open wounds or lesions.  No diffuse swelling.  No erythema. ? ?                         Skin over the chest wall and left forearm shows a maculopapular rash. ? ? ?ED Rto just at thatesults / Procedures / Treatments  ? ?Labs ?(all labs ordered are listed, but only abnormal results are displayed) ?Labs Reviewed - No data to display ? ? ?EKG ? ?Not indicated ? ? ?RADIOLOGY ? ?Image and radiology report reviewed by me. ? ?No acute findings of the left ankle. ? ?PROCEDURES: ? ?Critical  Care performed: No ? ?Procedures ? ? ?MEDICATIONS ORDERED IN ED: ?Medications - No data to display ? ? ?IMPRESSION / MDM / ASSESSMENT AND PLAN / ED COURSE  ? ?I have reviewed the triage note. ? ?Differential diagnosis includes, but is not limited to, synovitis, gout, ankle strain/sprain, arthritis ? ?51 year old female presenting to the emergency department for treatment and evaluation of cute onset of left ankle pain and rash over the chest wall and left forearm.  See HPI for further details. ? ?X-ray is reassuring and shows no acute changes.  Rash over the chest wall and left forearm may be secondary to some type of oil that she used.  She was advised to use hydrocortisone cream twice per day.  For her ankle pain, she will be placed in a cam walking boot and given a prescription for meloxicam.  If symptoms  or not improving with these therapies, she is to follow-up with podiatry.  She is to see primary care if the rash does not improve with hydrocortisone cream.  If symptoms change or worsen and she is unable to schedule an appointment she is to return to the emergency department. ? ?  ? ? ?FINAL CLINICAL IMPRESSION(S) / ED DIAGNOSES  ? ?Final diagnoses:  ?Acute left ankle pain  ?Rash and nonspecific skin eruption  ? ? ? ?Rx / DC Orders  ? ?ED Discharge Orders   ? ?      Ordered  ?  meloxicam (MOBIC) 15 MG tablet  Daily       ? 01/01/22 1831  ? ?  ?  ? ?  ? ? ? ?Note:  This document was prepared using Dragon voice recognition software and may include unintentional dictation errors. ?  ?Chinita Pester, FNP ?01/01/22 1901 ? ?  ?Gilles Chiquito, MD ?01/01/22 1929 ? ?

## 2022-01-01 NOTE — Discharge Instructions (Addendum)
Rest, ice 20 minutes per hour while awake, and elevate your foot as often as possible over the next couple of days. ? ?Follow up with podiatry if not improving over the week. ?Follow up with primary care if rash doesn't get better or it gets worse. ? ?Return to the ER for symptoms that change or worsen if unable to schedule an appointment. ?

## 2022-01-22 ENCOUNTER — Other Ambulatory Visit: Payer: Self-pay

## 2022-01-22 ENCOUNTER — Emergency Department
Admission: EM | Admit: 2022-01-22 | Discharge: 2022-01-22 | Disposition: A | Discharge status: Home / Self Care | Payer: Disability Insurance | Attending: Emergency Medicine | Admitting: Emergency Medicine

## 2022-01-22 DIAGNOSIS — X500XXA Overexertion from strenuous movement or load, initial encounter: Secondary | ICD-10-CM | POA: Insufficient documentation

## 2022-01-22 DIAGNOSIS — S99912A Unspecified injury of left ankle, initial encounter: Secondary | ICD-10-CM | POA: Insufficient documentation

## 2022-01-22 DIAGNOSIS — M25572 Pain in left ankle and joints of left foot: Secondary | ICD-10-CM

## 2022-01-22 NOTE — ED Provider Notes (Signed)
? ?  [facility] ?Provider Note ? ? ? Event Date/Time  ? First MD Initiated Contact with Patient 01/22/22 1346   ?  (approximate) ? ? ?History  ? ?Foot Injury ? ? ?HPI ? ?Heidi Acevedo is a 51 y.o. female here for reevaluation of left ankle injury.  Patient has been wearing cam boot for several weeks now.  She reports her pain is much improved.  She reports she needs a note to return to work ?  ? ? ?Physical Exam  ? ?Triage Vital Signs: ?ED Triage Vitals  ?Enc Vitals Group  ?   BP 01/22/22 1222 (!) 131/91  ?   Pulse Rate 01/22/22 1222 86  ?   Resp 01/22/22 1222 18  ?   Temp 01/22/22 1222 98.6 ?F (37 ?C)  ?   Temp Source 01/22/22 1222 Oral  ?   SpO2 01/22/22 1222 97 %  ?   Weight 01/22/22 1328 127 kg (279 lb 15.8 oz)  ?   Height 01/22/22 1328 1.753 m (5\' 9" )  ?   Head Circumference --   ?   Peak Flow --   ?   Pain Score 01/22/22 1221 4  ?   Pain Loc --   ?   Pain Edu? --   ?   Excl. in [place]? --   ? ? ?Most recent vital signs: ?Vitals:  ? 01/22/22 1222  ?BP: (!) 131/91  ?Pulse: 86  ?Resp: 18  ?Temp: 98.6 ?F (37 ?C)  ?SpO2: 97%  ? ? ? ?General: Awake, no distress.  ?CV:  Good peripheral perfusion.  ?Resp:  Normal effort.  ?Abd:  No distention.  ?Other:  Bleeding well without difficulty ? ? ?ED Results / Procedures / Treatments  ? ?Labs ?(all labs ordered are listed, but only abnormal results are displayed) ?Labs Reviewed - No data to display ? ? ?EKG ? ? ? ? ?RADIOLOGY ? ? ? ? ?PROCEDURES: ? ?Critical Care performed:  ? ?Procedures ? ? ?MEDICATIONS ORDERED IN ED: ?Medications - No data to display ? ? ?IMPRESSION / MDM / ASSESSMENT AND PLAN / ED COURSE  ?I reviewed the triage vital signs and the nursing notes. ? ? ?Patient is ambulating well, pain is improved, appropriate to remove cam boot and return to work ? ? ? ? ?  ? ? ?FINAL CLINICAL IMPRESSION(S) / ED DIAGNOSES  ? ?Final diagnoses:  ?Left ankle pain, unspecified chronicity  ? ? ? ?Rx / DC Orders  ? ?ED Discharge Orders   ? ? None  ? ?   ? ? ? ?Note:  This document was prepared using Dragon voice recognition software and may include unintentional dictation errors. ?  ?Lavonia Drafts, MD ?01/22/22 1458 ? ?

## 2022-01-22 NOTE — ED Triage Notes (Signed)
Pt comes with c/o left ankle pain. Pt has boot in place and states she needs to know if she can take it off. Pt has not followed up by orthopedics ?

## 2022-01-22 NOTE — ED Notes (Signed)
See triage note  presents with left foot in CAM boot  states she has been wearing this boot for 3 weeks  states she needs to know if she can remove the boot and go back to work ?

## 2022-07-21 IMAGING — CR DG FOOT COMPLETE 3+V*R*
1 series · 3 of 3 positions shown · non-contrast
Comparison: None.

CLINICAL DATA: ER via POV with complaints of right foot pain that
she woke up with. Reports that she works at [employer] 3rd shift, is on
her feet all night, and wears steel toed boots.

EXAM:
RIGHT FOOT COMPLETE - 3+ VIEW

[Series 1: dg foot complete right · 0.14mm/px · 3 of 3 slices shown]
[im 1/3]
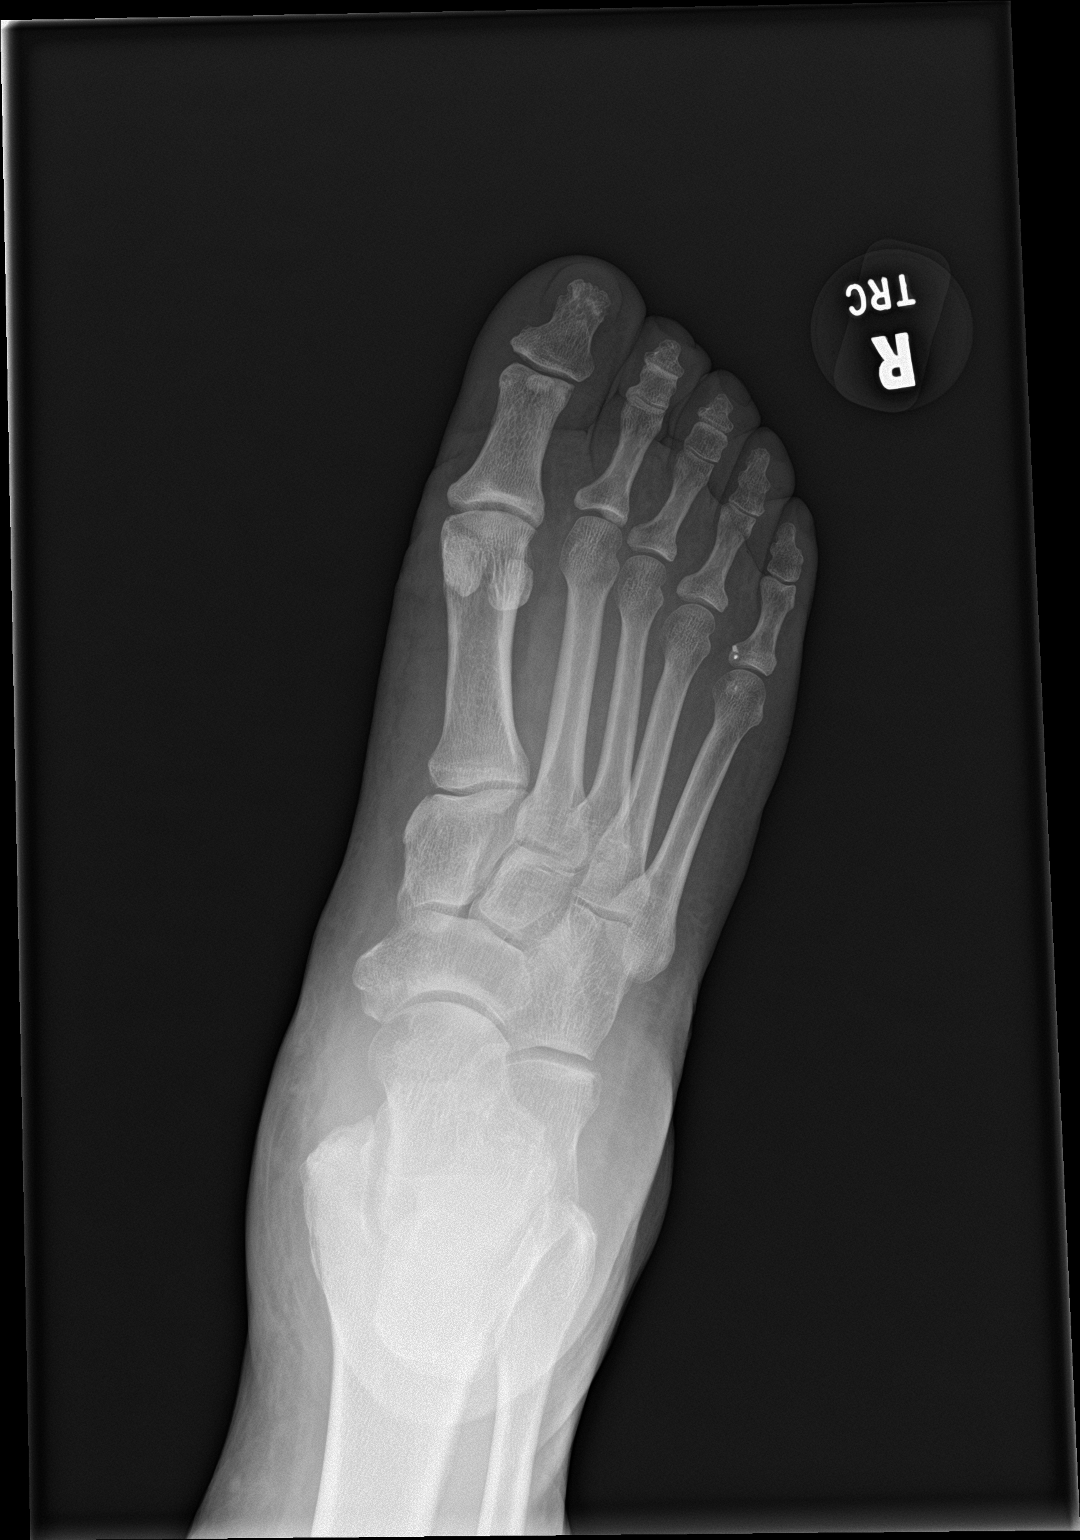
[im 2/3]
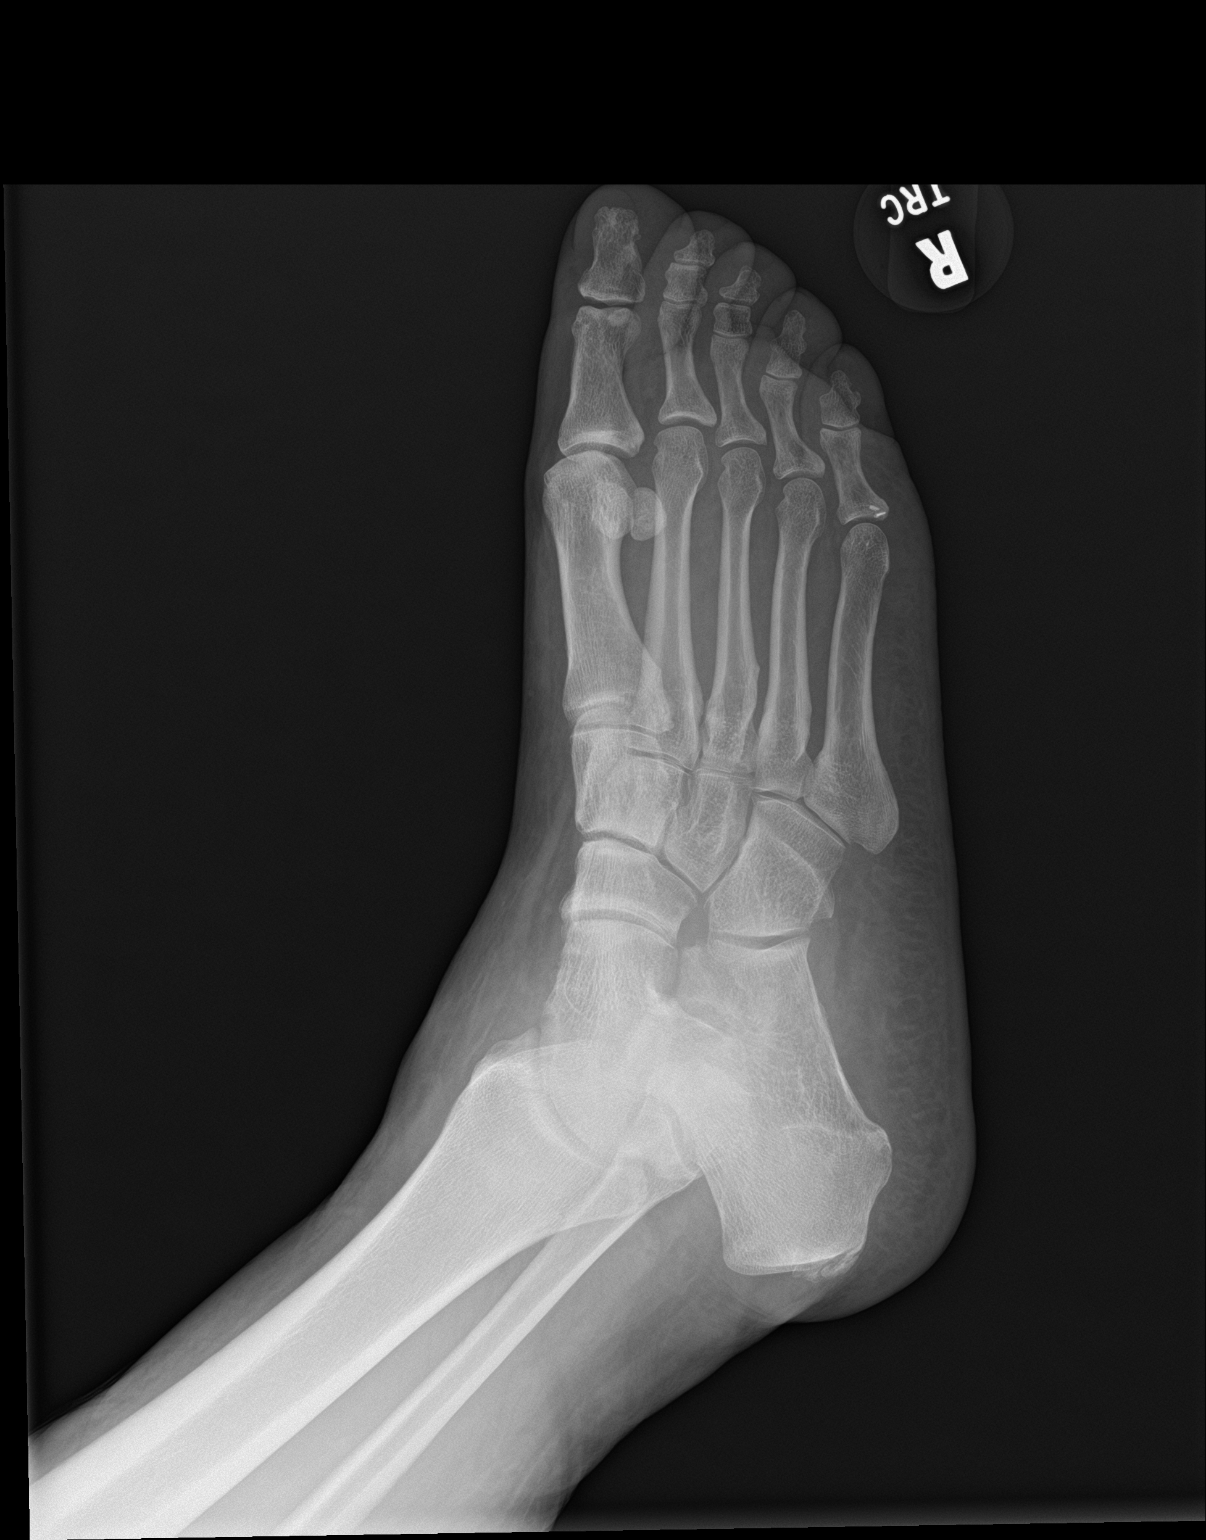
[im 3/3]
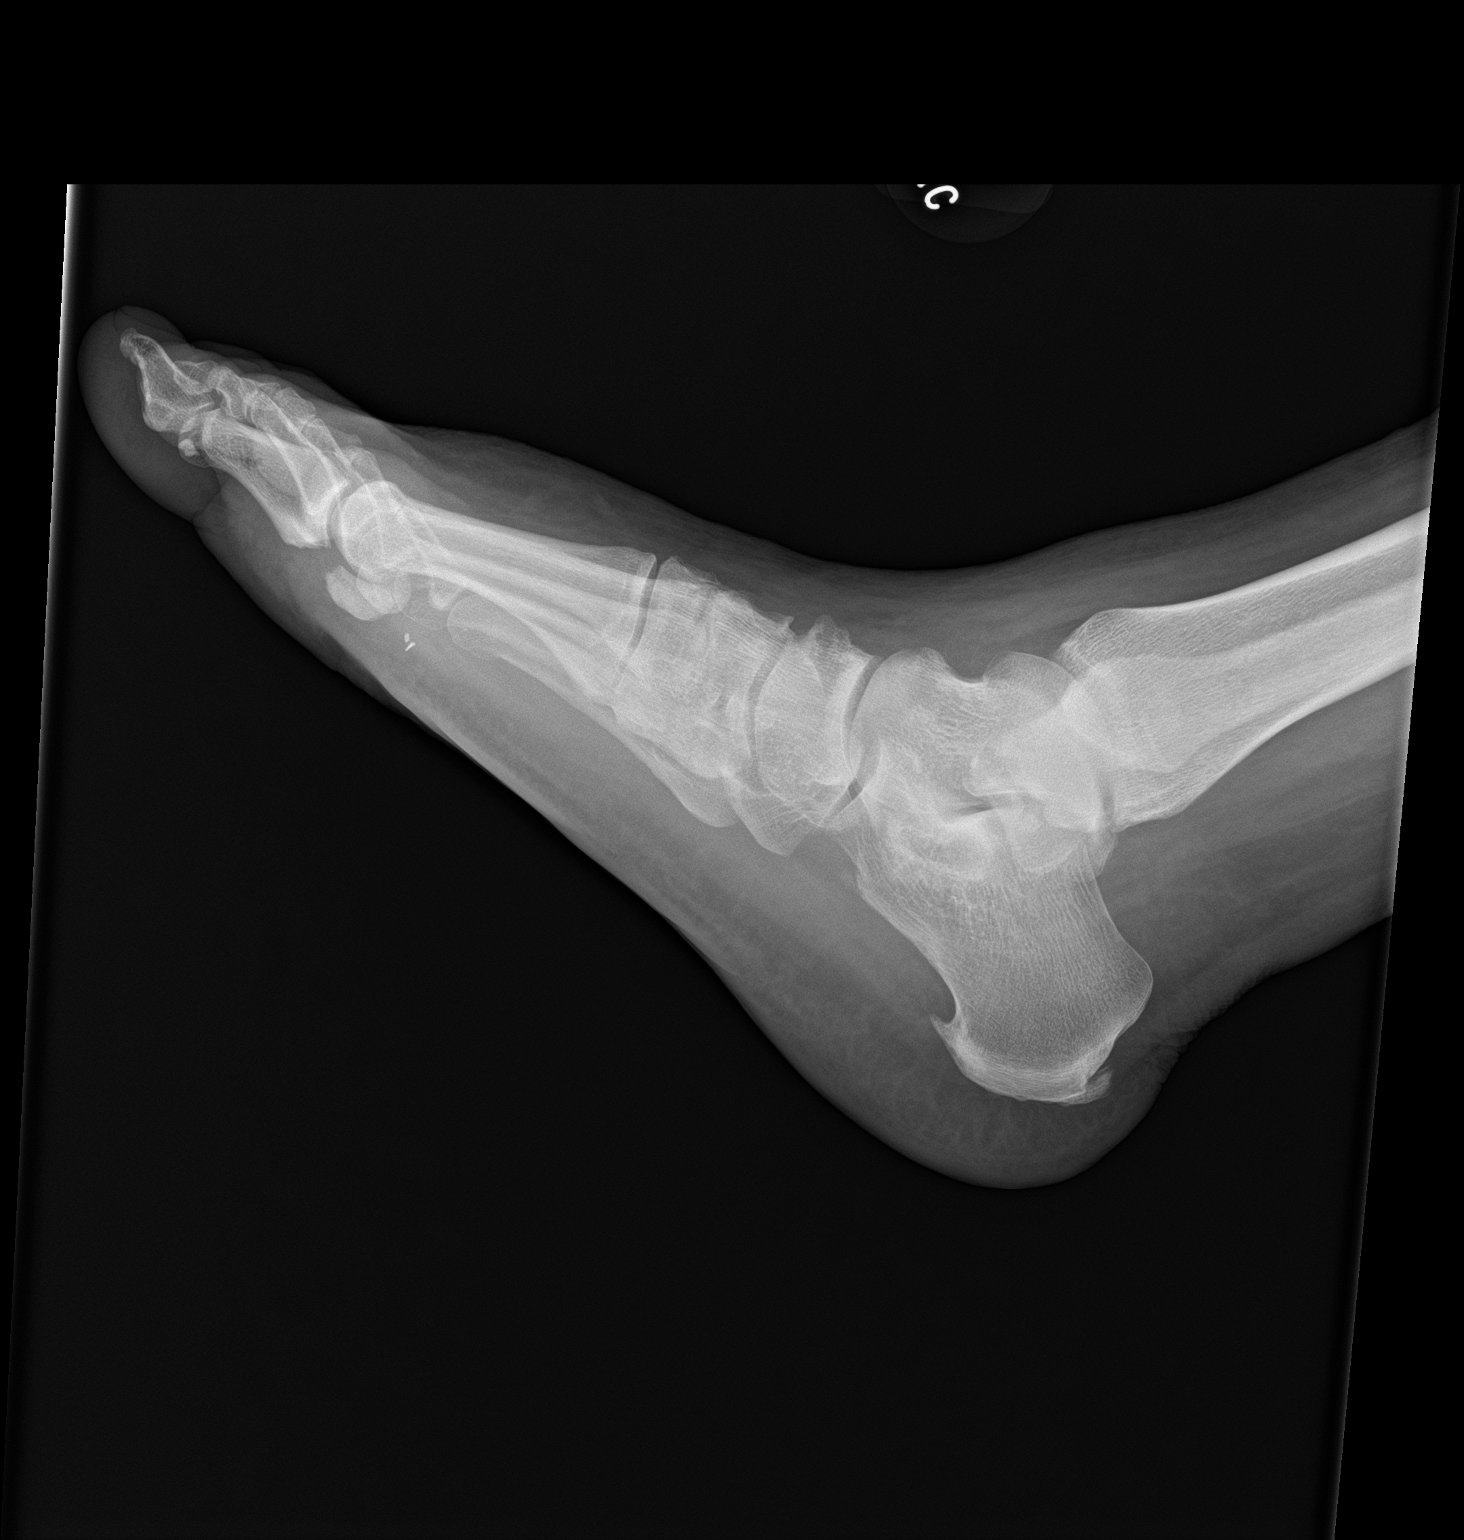

[3 of 3 positions shown; findings below may reference images not displayed]

FINDINGS: There is no evidence of fracture or dislocation. There is no
evidence of arthropathy or other focal bone abnormality. Soft
tissues are unremarkable. Posterior and plantar calcaneal spurs.
Couple triangular 2-3 mm radiopaque densities embedded within the
plantar soft tissues at the level of the base of the fifth proximal
phalanx. Associated subcutaneus soft tissue edema. Subcutaneus soft
tissue edema of the ankle.
IMPRESSION: Couple triangular 2-3 mm radiopaque densities embedded within the
plantar soft tissues at the level of the base of the fifth proximal
phalanx.
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
## 2022-11-28 ENCOUNTER — Emergency Department
Admission: EM | Admit: 2022-11-28 | Discharge: 2022-11-28 | Disposition: A | Discharge status: Home / Self Care | Payer: BC Managed Care – PPO | Attending: Emergency Medicine | Admitting: Emergency Medicine

## 2022-11-28 ENCOUNTER — Other Ambulatory Visit: Payer: Self-pay

## 2022-11-28 ENCOUNTER — Emergency Department: Payer: BC Managed Care – PPO

## 2022-11-28 DIAGNOSIS — M79605 Pain in left leg: Secondary | ICD-10-CM

## 2022-11-28 DIAGNOSIS — M175 Other unilateral secondary osteoarthritis of knee: Secondary | ICD-10-CM

## 2022-11-28 MED ORDER — MELOXICAM 15 MG PO TABS: 15.0000 mg | ORAL_TABLET | Freq: Every day | ORAL | 0 refills | Status: DC

## 2022-11-28 NOTE — ED Notes (Signed)
Pt reports left mid leg pain behind the knee. Pt reports swelling for a few days and she has been lumping do to the pain

## 2022-11-28 NOTE — Discharge Instructions (Addendum)
Follow-up with Dr. Leim Fabry if any continued problems or not improving.  A prescription for meloxicam was sent to the pharmacy for you to begin taking daily with food.  Discontinue taking this medication if there is any stomach upset.

## 2022-11-28 NOTE — ED Triage Notes (Signed)
Pt comes with c/o left leg pain for two day. Pt denies any known injury

## 2022-11-28 NOTE — ED Provider Notes (Signed)
[facility] Provider Note    Event Date/Time   First MD Initiated Contact with Patient 11/28/22 1102     (approximate)   History   Leg Pain   HPI  Khristen Cheyney is a 52 y.o. female presents to the ED with complaint of left leg pain and swelling for the last 2 days.  Patient denies any known injury, is non-smoker and no prolonged traveling.  She denies any injury to her leg and no previous DVT.     Physical Exam   Triage Vital Signs: ED Triage Vitals [11/28/22 1039]  Enc Vitals Group     BP (!) 142/89     Pulse Rate 91     Resp 18     Temp 98 F (36.7 C)     Temp src      SpO2 98 %     Weight      Height      Head Circumference      Peak Flow      Pain Score 0     Pain Loc      Pain Edu?      Excl. in [place]?     Most recent vital signs: Vitals:   11/28/22 1039  BP: (!) 142/89  Pulse: 91  Resp: 18  Temp: 98 F (36.7 C)  SpO2: 98%     General: Awake, no distress.  CV:  Good peripheral perfusion.  Heart regular rate and rhythm. Resp:  Normal effort.  Clear bilaterally. Abd:  No distention.  Other:  On examination of the lower extremities bilaterally in comparison the left is slightly edematous but without erythema or warmth noted.  There is some tenderness on palpation of the left posterior knee.  Patient is able to stand and ambulate without assistance.  Bevelyn Buckles' sign is negative.  Skin is intact.  Pulses are present bilaterally.  No effusion is noted of the left knee.   ED Results / Procedures / Treatments   Labs (all labs ordered are listed, but only abnormal results are displayed) Labs Reviewed - No data to display     RADIOLOGY Ultrasound left lower extremity per radiologist is negative for DVT. Left knee x-ray images were reviewed and interpreted by myself independent of the radiologist and was noted that patient had degenerative changes involving the medial aspect of the joint.    PROCEDURES:  Critical Care  performed:   Procedures   MEDICATIONS ORDERED IN ED: Medications - No data to display   IMPRESSION / MDM / [place] / ED COURSE  I reviewed the triage vital signs and the nursing notes.   Differential diagnosis includes, but is not limited to, left lower extremity pain, DVT, degenerative joint disease, bursitis, tendinitis.  52 year old female presents to the ED with complaint of left lower extremity pain and noted swelling for the last 2 days.  Patient denied any recent injury and in questioning her is at low risk for DVT.  Ultrasound ruled out DVT and patient was reassured.  X-ray was discussed with patient with degenerative joint changes found.  Patient will follow-up with Dr. Posey Pronto who is on-call for orthopedics if any continued problems.  A prescription for meloxicam was sent to the pharmacy for her to begin taking daily for inflammation and to see if this helps with her knee pain.       Patient's presentation is most consistent with acute complicated illness / injury requiring diagnostic workup.  FINAL CLINICAL IMPRESSION(S) /  ED DIAGNOSES   Final diagnoses:  Pain of left lower extremity  Other secondary osteoarthritis of left knee     Rx / DC Orders   ED Discharge Orders          Ordered    meloxicam (MOBIC) 15 MG tablet  Daily        11/28/22 1303             Note:  This document was prepared using Dragon voice recognition software and may include unintentional dictation errors.   Johnn Hai, PA-C 11/28/22 1312    Vanessa Deferiet, MD 11/29/22 [phone]

## 2022-11-28 NOTE — ED Triage Notes (Signed)
First Nurse Note:  C/O left leg pain.  States pain worse when bending knee.  AAOx3.  Skin warm and dry. NAD.  Ambulates with easy and steady gait, slightly favors right leg.

## 2023-05-21 ENCOUNTER — Emergency Department
Admission: EM | Admit: 2023-05-21 | Discharge: 2023-05-21 | Disposition: A | Discharge status: Home / Self Care | Payer: BLUE CROSS/BLUE SHIELD | Attending: Emergency Medicine | Admitting: Emergency Medicine

## 2023-05-21 ENCOUNTER — Emergency Department: Payer: BLUE CROSS/BLUE SHIELD

## 2023-05-21 ENCOUNTER — Other Ambulatory Visit: Payer: Self-pay

## 2023-05-21 DIAGNOSIS — M778 Other enthesopathies, not elsewhere classified: Secondary | ICD-10-CM

## 2023-05-21 DIAGNOSIS — M70841 Other soft tissue disorders related to use, overuse and pressure, right hand: Secondary | ICD-10-CM | POA: Insufficient documentation

## 2023-05-21 DIAGNOSIS — M25531 Pain in right wrist: Secondary | ICD-10-CM

## 2023-05-21 DIAGNOSIS — Y9389 Activity, other specified: Secondary | ICD-10-CM | POA: Diagnosis not present

## 2023-05-21 MED ORDER — MELOXICAM 15 MG PO TABS: 15.0000 mg | ORAL_TABLET | Freq: Every day | ORAL | 1 refills | Status: AC

## 2023-05-21 NOTE — ED Provider Notes (Signed)
[facility] Emergency Department Provider Note     Event Date/Time   First MD Initiated Contact with Patient 05/21/23 1154     (approximate)   History   Wrist Pain   HPI  Heidi Acevedo is a 52 y.o. female Presents to the ED with right wrist pain with onset yesterday.  She presents with nontraumatic wrist pain that she describes as pain across the dorsal radial wrist as well as some pain across the dorsal and volar wrist distally.  She notes pain with wrist flexion extension as well as fine pinching motor movements. She denies any swelling to the hand or wrist.    Physical Exam   Triage Vital Signs: ED Triage Vitals  Encounter Vitals Group     BP 05/21/23 1144 (!) 151/102     Systolic BP Percentile --      Diastolic BP Percentile --      Pulse Rate 05/21/23 1142 85     Resp 05/21/23 1142 20     Temp 05/21/23 1144 98.6 F (37 C)     Temp src --      SpO2 05/21/23 1144 96 %     Weight 05/21/23 1142 285 lb (129.3 kg)     Height 05/21/23 1142 5\' 9"  (1.753 m)     Head Circumference --      Peak Flow --      Pain Score 05/21/23 1142 5     Pain Loc --      Pain Education --      Exclude from Growth Chart --     Most recent vital signs: Vitals:   05/21/23 1144 05/21/23 1255  BP: (!) 151/102 (!) 148/99  Pulse:  80  Resp:  20  Temp: 98.6 F (37 C)   SpO2: 96% 97%    General Awake, no distress. NAD CV:  Good peripheral perfusion. No CCE distally RESP:  Normal effort.  ABD:  No distention.  MSK:  No soft tissue swelling or arthropathies noted to the right hand.  Normal composite fist on the right hand.  Normal intrinsic and opposition testing noted.  Positive Finkelstein exam of the right. NEURO: Normal intrinsic and opposition testing noted.  Normal UE DTRs bilaterally.  Negative carpal and cubital Tinel's.   ED Results / Procedures / Treatments   Labs (all labs ordered are listed, but only abnormal results are displayed) Labs  Reviewed - No data to display   EKG   RADIOLOGY  I personally viewed and evaluated these images as part of my medical decision making, as well as reviewing the written report by the radiologist.  ED Provider Interpretation: No acute findings  DG Wrist Complete Right  Result Date: 05/21/2023 CLINICAL DATA:  Right wrist pain starting yesterday. No known injury. EXAM: RIGHT WRIST - COMPLETE 3+ VIEW COMPARISON:  None Available. FINDINGS: There is 2 mm ulnar positive variance. Normal morphology and density of the lunate without evidence of sequela of ulnar abutment syndrome. Joint spaces are preserved. No acute fracture or dislocation. IMPRESSION: 1. No acute fracture or dislocation. 2. There is 2 mm ulnar positive variance. Electronically Signed   By: Neita Garnet M.D.   On: 05/21/2023 12:29     PROCEDURES:  Critical Care performed: No  Procedures   MEDICATIONS ORDERED IN ED: Medications - No data to display   IMPRESSION / MDM / ASSESSMENT AND PLAN / ED COURSE  I reviewed the triage vital signs and the nursing notes.  Differential diagnosis includes, but is not limited to, wrist sprain, wrist tendinitis, tenosynovitis, de Quervain's tenosynovitis, carpal tunnel syndrome  Patient's presentation is most consistent with acute complicated illness / injury requiring diagnostic workup.  Patient's diagnosis is consistent with right thumb tendinitis.  No radiologic evidence of any acute fracture or dislocation, based on my interpretation.  Patient will be discharged home with prescriptions for meloxicam.  Patient is also placed in a abducted thumb spica splint for support.  Patient is to follow up with a local PCP on the list provided, as needed or otherwise directed. Patient is given ED precautions to return to the ED for any worsening or new symptoms.     FINAL CLINICAL IMPRESSION(S) / ED DIAGNOSES   Final diagnoses:  Right wrist pain  Tendinitis of  thumb     Rx / DC Orders   ED Discharge Orders          Ordered    meloxicam (MOBIC) 15 MG tablet  Daily        05/21/23 1239             Note:  This document was prepared using Dragon voice recognition software and may include unintentional dictation errors.    Lissa Hoard, PA-C 05/21/23 1945    Pilar Jarvis, MD 05/28/23 2203

## 2023-05-21 NOTE — ED Triage Notes (Signed)
Pt to ED for right wrist pain started yesterday. Reports thinks carpel tunnel. Wearing brace.

## 2023-05-21 NOTE — Discharge Instructions (Addendum)
Your exam and XR are normal. Your symptoms are consistent with tendinitis. Wear the wrist brace while working. Use moist heat and cool compresses to promote healing. Take the prescription anti-inflammatory as directed.   Please go to the following website to schedule new (and existing) patient appointments:   [email or web address]   The following is a list of primary care offices in the area who are accepting new patients at this time.  Please reach out to one of them directly and let them know you would like to schedule an appointment to follow up on an Emergency Department visit, and/or to establish a new primary care provider (PCP).  There are likely other primary care clinics in the are who are accepting new patients, but this is an excellent place to start:  [facility] Lead physician: Dr Shirlee Latch [address] [place], [place] [address] [phone]  [facility] Lead Physician: Dr Alba Cory [address], [place], [place] [address] [phone]  [facility]  Lead Physician: Dr Olevia Perches [address] [place], [place] [address] [phone]  [facility] Lead Physician: Dr Sofie Hartigan [address] [place], [place] [address] [phone]  [facility] at [facility] Lead Physician: Dr Bari Edward [address] [place], [place], [place] [address] [phone]

## 2023-05-21 NOTE — ED Notes (Signed)
See triage note  Presents with pain to right wrist Denies any injury states hx Carpel tunnel

## 2023-08-01 NOTE — Progress Notes (Deleted)
    New patient visit   Patient: Heidi Acevedo   DOB: 1971/06/24   52 y.o. Female  MRN: [id number] Visit Date: 08/02/2023  Today's healthcare provider: Ronnald Ramp, MD   No chief complaint on file.  Subjective    Heidi Acevedo is a 52 y.o. female who presents today as a new patient to establish care.      Discussed the use of AI scribe software for clinical note transcription with the patient, who gave verbal consent to proceed.  History of Present Illness              No past medical history on file.  No outpatient medications prior to visit.   No facility-administered medications prior to visit.    Past Surgical History:  Procedure Laterality Date   ECTOPIC PREGNANCY SURGERY     No family status information on file.   No family history on file. Social History   Socioeconomic History   Marital status: Single    Spouse name: Not on file   Number of children: Not on file   Years of education: Not on file   Highest education level: Not on file  Occupational History   Not on file  Tobacco Use   Smoking status: Never   Smokeless tobacco: Never  Substance and Sexual Activity   Alcohol use: No   Drug use: No   Sexual activity: Not on file  Other Topics Concern   Not on file  Social History Narrative   Not on file   Social Determinants of Health   Financial Resource Strain: Not on file  Food Insecurity: Not on file  Transportation Needs: Not on file  Physical Activity: Not on file  Stress: Not on file  Social Connections: Not on file     No Known Allergies   There is no immunization history on file for this patient.  Health Maintenance  Topic Date Due   HIV Screening  Never done   Hepatitis C Screening  Never done   DTaP/Tdap/Td (1 - Tdap) Never done   Cervical Cancer Screening (HPV/Pap Cotest)  Never done   Colonoscopy  Never done   MAMMOGRAM  Never done   Zoster Vaccines- Shingrix (1 of 2) Never done   INFLUENZA  VACCINE  Never done   COVID-19 Vaccine (1 - 2023-24 season) Never done   HPV VACCINES  Aged Out    Patient Care Team: Pcp, No as PCP - General  Review of Systems  {Insert previous labs (optional):23779} {See past labs  Heme  Chem  Endocrine  Serology  Results Review (optional):1}   Objective    There were no vitals taken for this visit. {Insert last BP/Wt (optional):23777}{See vitals history (optional):1}    Depression Screen     No data to display         No results found for any visits on 08/02/23.   Physical Exam ***    Assessment & Plan      Problem List Items Addressed This Visit   None   Assessment and Plan               No follow-ups on file.      Ronnald Ramp, MD  [facility] [phone] (phone) [phone] (fax)  [facility]

## 2023-08-02 ENCOUNTER — Ambulatory Visit: Payer: Self-pay | Admitting: Family Medicine

## 2023-08-06 DIAGNOSIS — M1712 Unilateral primary osteoarthritis, left knee: Secondary | ICD-10-CM | POA: Diagnosis present

## 2023-08-09 DIAGNOSIS — R7303 Prediabetes: Secondary | ICD-10-CM | POA: Diagnosis present

## 2023-09-03 DIAGNOSIS — L2084 Intrinsic (allergic) eczema: Secondary | ICD-10-CM | POA: Diagnosis present

## 2023-10-19 ENCOUNTER — Emergency Department: Payer: Disability Insurance

## 2023-10-19 ENCOUNTER — Other Ambulatory Visit: Payer: Self-pay

## 2023-10-19 DIAGNOSIS — S29012A Strain of muscle and tendon of back wall of thorax, initial encounter: Secondary | ICD-10-CM | POA: Diagnosis not present

## 2023-10-19 DIAGNOSIS — M25471 Effusion, right ankle: Secondary | ICD-10-CM | POA: Diagnosis not present

## 2023-10-19 DIAGNOSIS — S39012A Strain of muscle, fascia and tendon of lower back, initial encounter: Secondary | ICD-10-CM | POA: Diagnosis not present

## 2023-10-19 DIAGNOSIS — S3992XA Unspecified injury of lower back, initial encounter: Secondary | ICD-10-CM | POA: Diagnosis present

## 2023-10-19 DIAGNOSIS — M25472 Effusion, left ankle: Secondary | ICD-10-CM | POA: Insufficient documentation

## 2023-10-19 DIAGNOSIS — X509XXA Other and unspecified overexertion or strenuous movements or postures, initial encounter: Secondary | ICD-10-CM | POA: Insufficient documentation

## 2023-10-19 NOTE — ED Triage Notes (Signed)
Pt presents to ER with c/o BIL ankle pain/swelling and right side lower back pain that started today after she bent down to get something.  Pt reports she suddenly had a feeling somebody was punching her in her back.  Pt states she is also having some pain in her BIL ankle that she states is from her swelling.  Pt denies any injury to ankles, and is otherwise A&O x4 and in NAD at this time.

## 2023-10-20 ENCOUNTER — Emergency Department
Admission: EM | Admit: 2023-10-20 | Discharge: 2023-10-20 | Disposition: A | Discharge status: Home / Self Care | Payer: Disability Insurance | Attending: Emergency Medicine | Admitting: Emergency Medicine

## 2023-10-20 DIAGNOSIS — S39012A Strain of muscle, fascia and tendon of lower back, initial encounter: Secondary | ICD-10-CM

## 2023-10-20 DIAGNOSIS — S29019A Strain of muscle and tendon of unspecified wall of thorax, initial encounter: Secondary | ICD-10-CM

## 2023-10-20 MED ORDER — DEXAMETHASONE SODIUM PHOSPHATE 10 MG/ML IJ SOLN
10.0000 mg | Freq: Once | INTRAMUSCULAR | Status: AC
  Administered 2023-10-20: 10 mg via INTRAMUSCULAR
  Filled 2023-10-20: qty 1

## 2023-10-20 MED ORDER — LIDOCAINE 5 % EX PTCH: 1.0000 | MEDICATED_PATCH | CUTANEOUS | 0 refills | Status: AC

## 2023-10-20 MED ORDER — IBUPROFEN 800 MG PO TABS: 800.0000 mg | ORAL_TABLET | Freq: Three times a day (TID) | ORAL | 0 refills | Status: DC | PRN

## 2023-10-20 MED ORDER — LIDOCAINE 5 % EX PTCH
1.0000 | MEDICATED_PATCH | Freq: Once | CUTANEOUS | Status: DC
  Administered 2023-10-20: 1 via TRANSDERMAL
  Filled 2023-10-20: qty 1

## 2023-10-20 MED ORDER — METHOCARBAMOL 500 MG PO TABS: 500.0000 mg | ORAL_TABLET | Freq: Three times a day (TID) | ORAL | 0 refills | Status: AC | PRN

## 2023-10-20 MED ORDER — KETOROLAC TROMETHAMINE 30 MG/ML IJ SOLN
60.0000 mg | Freq: Once | INTRAMUSCULAR | Status: AC
  Administered 2023-10-20: 60 mg via INTRAMUSCULAR
  Filled 2023-10-20: qty 2

## 2023-10-20 NOTE — ED Notes (Signed)
Reviewed D/C information with the patient, pt verbalized understanding. No additional concerns at this time.

## 2023-10-20 NOTE — ED Provider Notes (Signed)
[facility] Provider Note    Event Date/Time   First MD Initiated Contact with Patient 10/20/23 [phone]     (approximate)   History   Back Pain   HPI  Heidi Acevedo is a 52 y.o. female with no significant past medical history who presents to the emergency department with right lower back pain ongoing for several days worse after bending over.  No other injury.  No numbness, tingling or weakness.  Able to ambulate.  No bowel or bladder incontinence.  No fever.  No previous back surgery.  Also complaining of mild bilateral ankle swelling.  States she is on her feet for long hours every day working.  No calf tenderness or calf swelling.  No history of CHF.   History provided by patient.    History reviewed. No pertinent past medical history.  Past Surgical History:  Procedure Laterality Date   ECTOPIC PREGNANCY SURGERY      MEDICATIONS:  Prior to Admission medications   Not on File    Physical Exam   Triage Vital Signs: ED Triage Vitals  Encounter Vitals Group     BP 10/19/23 2257 (!) 162/99     Systolic BP Percentile --      Diastolic BP Percentile --      Pulse Rate 10/19/23 2257 80     Resp 10/19/23 2257 20     Temp 10/19/23 2257 97.8 F (36.6 C)     Temp Source 10/19/23 2257 Oral     SpO2 10/19/23 2257 97 %     Weight 10/19/23 2303 285 lb (129.3 kg)     Height 10/19/23 2303 5\' 9"  (1.753 m)     Head Circumference --      Peak Flow --      Pain Score 10/19/23 2302 8     Pain Loc --      Pain Education --      Exclude from Growth Chart --     Most recent vital signs: Vitals:   10/19/23 2257  BP: (!) 162/99  Pulse: 80  Resp: 20  Temp: 97.8 F (36.6 C)  SpO2: 97%     CONSTITUTIONAL: Alert and responds appropriately to questions. Well-appearing; well-nourished HEAD: Normocephalic, atraumatic EYES: Conjunctivae clear, pupils appear equal ENT: normal nose; moist mucous membranes NECK: Normal range of motion CARD: Regular  rate and rhythm RESP: Normal chest excursion without splinting or tachypnea; no hypoxia or respiratory distress, speaking full sentences ABD/GI: non-distended BACK: Tender over the right thoracic and lumbar paraspinal muscles without overlying skin changes.  No midline spinal tenderness, step-off or deformity. EXT: Normal ROM in all joints, no major deformities noted, trace edema to bilateral dorsal feet and ankles, extremities warm and well-perfused, compartments soft, no calf tenderness or calf swelling SKIN: Normal color for age and race, no rashes on exposed skin NEURO: Moves all extremities equally, normal speech, no facial asymmetry noted, normal gait, 2+ deep tendon reflexes in bilateral lower extremities, no saddle anesthesia, normal sensation PSYCH: The patient's mood and manner are appropriate. Grooming and personal hygiene are appropriate.  ED Results / Procedures / Treatments   LABS: (all labs ordered are listed, but only abnormal results are displayed) Labs Reviewed - No data to display   EKG:  RADIOLOGY: My personal review and interpretation of imaging: X-ray of the thoracic spine is unremarkable.  I have personally reviewed all radiology reports. DG Thoracic Spine 2 View Result Date: 10/19/2023 CLINICAL DATA:  Right back pain  EXAM: THORACIC SPINE 2 VIEWS COMPARISON:  None Available. FINDINGS: There is no evidence of thoracic spine fracture. Alignment is normal. No other significant bone abnormalities are identified. IMPRESSION: Negative. Electronically Signed   By: Charlett Nose M.D.   On: 10/19/2023 23:52     PROCEDURES:  Critical Care performed: No     Procedures    IMPRESSION / MDM / ASSESSMENT AND PLAN / ED COURSE  I reviewed the triage vital signs and the nursing notes.   Patient here with right thoracic and lumbar paraspinal muscle tenderness.     DIFFERENTIAL DIAGNOSIS (includes but not limited to):   Muscle strain, muscle spasm, doubt fracture,  doubt cauda equina, epidural abscess or hematoma, discitis or osteomyelitis, transverse myelitis  Patient's presentation is most consistent with acute complicated illness / injury requiring diagnostic workup.  PLAN: X-ray of the thoracic spine obtained from triage and reviewed/interpreted by myself and radiology.  It is unremarkable.  Pain seems to be musculoskeletal in nature, likely strain/spasm from bending over.  No red flag symptoms or neurodeficits.  Will give Toradol, Decadron here for symptomatic relief.  Will discharge with ibuprofen, Robaxin.  As for her mild ankle swelling, I suspect that this is likely from standing for long hours for work.  Recommended compression stockings, elevation, rest.  Doubt CHF.  No sign of compartment syndrome, cellulitis, gout, septic arthritis, arterial obstruction, DVT.   MEDICATIONS GIVEN IN ED: Medications  ketorolac (TORADOL) 30 MG/ML injection 60 mg (has no administration in time range)  dexamethasone (DECADRON) injection 10 mg (has no administration in time range)  lidocaine (LIDODERM) 5 % 1 patch (has no administration in time range)     ED COURSE:  At this time, I do not feel there is any life-threatening condition present. I reviewed all nursing notes, vitals, pertinent previous records.  All lab and urine results, EKGs, imaging ordered have been independently reviewed and interpreted by myself.  I reviewed all available radiology reports from any imaging ordered this visit.  Based on my assessment, I feel the patient is safe to be discharged home without further emergent workup and can continue workup as an outpatient as needed. Discussed all findings, treatment plan as well as usual and customary return precautions.  They verbalize understanding and are comfortable with this plan.  Outpatient follow-up has been provided as needed.  All questions have been answered.    CONSULTS:  none   OUTSIDE RECORDS REVIEWED: Reviewed last internal  medicine note on 08/06/2023.     FINAL CLINICAL IMPRESSION(S) / ED DIAGNOSES   Final diagnoses:  Lumbar strain, initial encounter  Strain of thoracic region, initial encounter     Rx / DC Orders   ED Discharge Orders          Ordered    ibuprofen (ADVIL) 800 MG tablet  Every 8 hours PRN        10/20/23 0247    lidocaine (LIDODERM) 5 %  Every 24 hours        10/20/23 0247    methocarbamol (ROBAXIN) 500 MG tablet  Every 8 hours PRN        10/20/23 0247             Note:  This document was prepared using Dragon voice recognition software and may include unintentional dictation errors.   Ottie Neglia, Layla Maw, DO 10/20/23 [phone]

## 2023-11-04 ENCOUNTER — Other Ambulatory Visit: Payer: Self-pay

## 2023-11-04 ENCOUNTER — Emergency Department: Payer: BLUE CROSS/BLUE SHIELD

## 2023-11-04 ENCOUNTER — Observation Stay
Admission: EM | Admit: 2023-11-04 | Discharge: 2023-11-05 | Disposition: A | Discharge status: Home / Self Care | Payer: BLUE CROSS/BLUE SHIELD | Attending: Internal Medicine | Admitting: Internal Medicine

## 2023-11-04 DIAGNOSIS — R200 Anesthesia of skin: Principal | ICD-10-CM

## 2023-11-04 DIAGNOSIS — M1712 Unilateral primary osteoarthritis, left knee: Secondary | ICD-10-CM | POA: Insufficient documentation

## 2023-11-04 DIAGNOSIS — G459 Transient cerebral ischemic attack, unspecified: Secondary | ICD-10-CM | POA: Diagnosis not present

## 2023-11-04 DIAGNOSIS — R29818 Other symptoms and signs involving the nervous system: Secondary | ICD-10-CM | POA: Diagnosis not present

## 2023-11-04 DIAGNOSIS — R202 Paresthesia of skin: Secondary | ICD-10-CM | POA: Diagnosis not present

## 2023-11-04 DIAGNOSIS — R590 Localized enlarged lymph nodes: Secondary | ICD-10-CM | POA: Diagnosis not present

## 2023-11-04 DIAGNOSIS — R7303 Prediabetes: Secondary | ICD-10-CM | POA: Diagnosis present

## 2023-11-04 DIAGNOSIS — L2084 Intrinsic (allergic) eczema: Secondary | ICD-10-CM | POA: Insufficient documentation

## 2023-11-04 DIAGNOSIS — Z6841 Body Mass Index (BMI) 40.0 and over, adult: Secondary | ICD-10-CM | POA: Diagnosis not present

## 2023-11-04 LAB — CBC WITH DIFFERENTIAL/PLATELET
Abs Immature Granulocytes: 0.03 10*3/uL (ref 0.00–0.07)
Basophils Absolute: 0.1 10*3/uL (ref 0.0–0.1)
Basophils Relative: 0 %
Eosinophils Absolute: 0.5 10*3/uL (ref 0.0–0.5)
Eosinophils Relative: 4 %
HCT: 37 % (ref 36.0–46.0)
Hemoglobin: 11.9 g/dL — ABNORMAL LOW (ref 12.0–15.0)
Immature Granulocytes: 0 %
Lymphocytes Relative: 27 %
Lymphs Abs: 3.2 10*3/uL (ref 0.7–4.0)
MCH: 28.3 pg (ref 26.0–34.0)
MCHC: 32.2 g/dL (ref 30.0–36.0)
MCV: 88.1 fL (ref 80.0–100.0)
Monocytes Absolute: 0.6 10*3/uL (ref 0.1–1.0)
Monocytes Relative: 5 %
Neutro Abs: 7.7 10*3/uL (ref 1.7–7.7)
Neutrophils Relative %: 64 %
Platelets: 386 10*3/uL (ref 150–400)
RBC: 4.2 MIL/uL (ref 3.87–5.11)
RDW: 14.2 % (ref 11.5–15.5)
WBC: 12.1 10*3/uL — ABNORMAL HIGH (ref 4.0–10.5)
nRBC: 0 % (ref 0.0–0.2)

## 2023-11-04 LAB — URINALYSIS, ROUTINE W REFLEX MICROSCOPIC
Bilirubin Urine: NEGATIVE
Glucose, UA: NEGATIVE mg/dL
Hgb urine dipstick: NEGATIVE
Ketones, ur: NEGATIVE mg/dL
Leukocytes,Ua: NEGATIVE
Nitrite: NEGATIVE
Protein, ur: NEGATIVE mg/dL
Specific Gravity, Urine: >1.046 — ABNORMAL HIGH (ref 1.005–1.030)
pH: 5 (ref 5.0–8.0)

## 2023-11-04 LAB — URINE DRUG SCREEN, QUALITATIVE (ARMC ONLY)
Amphetamines, Ur Screen: NOT DETECTED
Barbiturates, Ur Screen: NOT DETECTED
Benzodiazepine, Ur Scrn: NOT DETECTED
Cannabinoid 50 Ng, Ur ~~LOC~~: NOT DETECTED
Cocaine Metabolite,Ur ~~LOC~~: NOT DETECTED
MDMA (Ecstasy)Ur Screen: NOT DETECTED
Methadone Scn, Ur: NOT DETECTED
Opiate, Ur Screen: NOT DETECTED
Phencyclidine (PCP) Ur S: NOT DETECTED
Tricyclic, Ur Screen: NOT DETECTED

## 2023-11-04 LAB — LIPID PANEL
Cholesterol: 214 mg/dL — ABNORMAL HIGH (ref 0–200)
HDL: 54 mg/dL (ref >40)
LDL Cholesterol: 141 mg/dL — ABNORMAL HIGH (ref 0–99)
Total CHOL/HDL Ratio: 4 {ratio}
Triglycerides: 93 mg/dL (ref <150)
VLDL: 19 mg/dL (ref 0–40)

## 2023-11-04 LAB — COMPREHENSIVE METABOLIC PANEL
ALT: 20 U/L (ref 0–44)
AST: 19 U/L (ref 15–41)
Albumin: 3.8 g/dL (ref 3.5–5.0)
Alkaline Phosphatase: 91 U/L (ref 38–126)
Anion gap: 10 (ref 5–15)
BUN: 11 mg/dL (ref 6–20)
CO2: 25 mmol/L (ref 22–32)
Calcium: 8.7 mg/dL — ABNORMAL LOW (ref 8.9–10.3)
Chloride: 103 mmol/L (ref 98–111)
Creatinine, Ser: 0.83 mg/dL (ref 0.44–1.00)
GFR, Estimated: >60 mL/min (ref >60)
Glucose, Bld: 110 mg/dL — ABNORMAL HIGH (ref 70–99)
Potassium: 4 mmol/L (ref 3.5–5.1)
Sodium: 138 mmol/L (ref 135–145)
Total Bilirubin: 0.6 mg/dL (ref 0.0–1.2)
Total Protein: 7.8 g/dL (ref 6.5–8.1)

## 2023-11-04 LAB — PROTIME-INR
INR: 1.1 (ref 0.8–1.2)
Prothrombin Time: 14.7 s (ref 11.4–15.2)

## 2023-11-04 LAB — CBG MONITORING, ED: Glucose-Capillary: 104 mg/dL — ABNORMAL HIGH (ref 70–99)

## 2023-11-04 LAB — APTT: aPTT: 30 s (ref 24–36)

## 2023-11-04 LAB — ETHANOL: Alcohol, Ethyl (B): <10 mg/dL (ref <10)

## 2023-11-04 LAB — MAGNESIUM: Magnesium: 2.1 mg/dL (ref 1.7–2.4)

## 2023-11-04 MED ORDER — ONDANSETRON HCL 4 MG/2ML IJ SOLN
INTRAMUSCULAR | Status: AC
  Filled 2023-11-04: qty 2

## 2023-11-04 MED ORDER — ASPIRIN 81 MG PO TBEC
81.0000 mg | DELAYED_RELEASE_TABLET | Freq: Every day | ORAL | Status: DC
  Administered 2023-11-05: 81 mg via ORAL
  Filled 2023-11-04: qty 1

## 2023-11-04 MED ORDER — ASPIRIN 325 MG PO TBEC
650.0000 mg | DELAYED_RELEASE_TABLET | Freq: Once | ORAL | Status: AC
  Administered 2023-11-04: 650 mg via ORAL
  Filled 2023-11-04: qty 2

## 2023-11-04 MED ORDER — ASPIRIN 325 MG PO TBEC: 325.0000 mg | DELAYED_RELEASE_TABLET | Freq: Every day | ORAL | Status: DC

## 2023-11-04 MED ORDER — STROKE: EARLY STAGES OF RECOVERY BOOK: Freq: Once | Status: DC

## 2023-11-04 MED ORDER — IOHEXOL 350 MG/ML SOLN
100.0000 mL | Freq: Once | INTRAVENOUS | Status: AC | PRN
  Administered 2023-11-04: 100 mL via INTRAVENOUS

## 2023-11-04 NOTE — Consult Note (Signed)
 [facility] Consult Services   Patient Name:   Heidi Acevedo, Heidi Acevedo Date of Birth:   02-08-1971 Identification Number:   MRN - [id number] Date of Service:   11/04/2023 19:04:51  Diagnosis:       I63.89 - Cerebrovascular accident (CVA) due to other mechanism [facility])  Impression:      Acute Right-Sided Sensory Symptoms: TIA or Stroke  - 53 year old female presenting with sudden onset of right-sided numbness and tingling at approximately 1830 hours. Examination reveals subtle right-side ataxia. The clinical presentation raises high suspicion for an acute cerebrovascular event, potentially a transient ischemic attack (TIA) or stroke.   - Admit for comprehensive stroke workup.   - Order laboratory studies including CBC, CMP, UA, TSH, LDL, A1C.   - Schedule neuroimaging: MRI brain with and without contrast, and vessel imaging (CTA/MRA head and neck).   - Initiate antiplatelet therapy:   - Aspirin : Load with 325 mg, followed by 81 mg daily.   - Clopidogrel : Load with 300 mg, then continue with 75 mg daily for 21 days (extend to 90 days if intracranial atherosclerotic disease is identified on imaging).   - Begin statin therapy for secondary stroke prevention.   - Implement permissive hypertension protocol allowing up to 220/120 mmHg for the next 24 hours.   - Perform a 2D echocardiogram to evaluate cardiac function.   - Refer for PT/OT/SLP assessment to determine rehabilitation potential.  Our recommendations are outlined below.  Recommendations:        Stroke/Telemetry Floor       Neuro Checks       Bedside Swallow Eval       DVT Prophylaxis       IV Fluids, Normal Saline       Head of Bed 30 Degrees       Euglycemia and Avoid Hyperthermia (PRN Acetaminophen )  Sign Out:       Discussed with Emergency Department Provider    ------------------------------------------------------------------------------  Advanced Imaging: CTA Head and Neck  Completed.  LVO:No  Patient in not a candidate for NIR   Metrics: Last Known Well: 11/04/2023 18:30:00 Dispatch Time: 11/04/2023 19:04:51 Arrival Time: 11/04/2023 18:34:00 Initial Response Time: 11/04/2023 19:05:31 Symptoms: right sided numbness. Initial patient interaction: 11/04/2023 19:19:33 NIHSS Assessment Completed: 11/04/2023 19:31:51 Patient is not a candidate for Thrombolytic. Thrombolytic Medical Decision: 11/04/2023 19:31:53 Patient was not deemed candidate for Thrombolytic because of following reasons: Stroke severity too mild (non-disabling) .  CT head showed no acute hemorrhage or acute core infarct.  Primary Provider Notified of Diagnostic Impression and Management Plan on: 11/04/2023 19:49:42    ------------------------------------------------------------------------------  History of Present Illness: Patient is a 53 year old Female.  Patient was brought by private transportation with symptoms of right sided numbness. The patient presents with a chief complaint of sudden onset right-sided numbness and tingling. The symptoms began at approximately 1830 hours. She denies experiencing any slurred speech, facial asymmetry, or gross weakness.  The patient reports no significant past medical history. During the assessment, subtle right-sided ataxia was observed. The patient's primary concern remains the right-sided numbness and tingling.   Past Medical History:      There is no history of Hypertension      There is no history of Diabetes Mellitus  Medications:  No Anticoagulant use  No Antiplatelet use Reviewed EMR for current medications  Allergies:  Reviewed  Social History: Drug Use: No  Family History:  There is no family history of premature cerebrovascular disease pertinent to this consultation  ROS : 14 Points Review of Systems was performed and was negative except mentioned in HPI.  Past Surgical History: There Is No Surgical History  Contributory To Today's Visit    Examination: BP(158/95), Pulse(78), 1A: Level of Consciousness - Alert; keenly responsive + 0 1B: Ask Month and Age - Both Questions Right + 0 1C: Blink Eyes & Squeeze Hands - Performs Both Tasks + 0 2: Test Horizontal Extraocular Movements - Normal + 0 3: Test Visual Fields - No Visual Loss + 0 4: Test Facial Palsy (Use Grimace if Obtunded) - Normal symmetry + 0 5A: Test Left Arm Motor Drift - No Drift for 10 Seconds + 0 5B: Test Right Arm Motor Drift - No Drift for 10 Seconds + 0 6A: Test Left Leg Motor Drift - No Drift for 5 Seconds + 0 6B: Test Right Leg Motor Drift - No Drift for 5 Seconds + 0 7: Test Limb Ataxia (FNF/Heel-Shin) - Ataxia in 2 Limbs + 2 8: Test Sensation - Mild-Moderate Loss: Can Sense Being Touched + 1 9: Test Language/Aphasia - Normal; No aphasia + 0 10: Test Dysarthria - Normal + 0 11: Test Extinction/Inattention - No abnormality + 0  NIHSS Score: 3   Pre-Morbid Modified Rankin Scale: 0 Points = No symptoms at all  Spoke with : Dr. Malvina  This consult was conducted in real time using interactive audio and immunologist. Patient was informed of the technology being used for this visit and agreed to proceed. Patient located in hospital and provider located at home/office setting.   Patient is being evaluated for possible acute neurologic impairment and high probability of imminent or life-threatening deterioration. I spent total of 35 minutes providing care to this patient, including time for face to face visit via telemedicine, review of medical records, imaging studies and discussion of findings with providers, the patient and/or family.   Dr Wilhemenia Camba   [facility] For Inpatient follow-up with [facility] physician please call [facility] at [phone]. As we are not an outpatient service for any post hospital discharge needs please contact the hospital for assistance. If you have any questions for the  [facility] physicians or need to reconsult for clinical or diagnostic changes please contact us  via [facility] at [phone].

## 2023-11-04 NOTE — H&P (Signed)
 Marland Kitchen

## 2023-11-04 NOTE — Progress Notes (Signed)
 Code stroke activated at 1857. Patient in CT at 1857. EDRN states patient came in for right sided facial, arm and leg tingling states LKW was 1825. States per EDP she is VAN negative. Pre pull tnk at 1902. Denies being on any blood thinners. Back from CT at 1902. Patient states her mRs. Is 0. Telespecialist paged at 1904. Dr. Gar on camera for evaluation, pt report and CT results at 1914. States no TNK at this time admit for MRI and further neuro eval. Off camera at 1930.

## 2023-11-04 NOTE — Consult Note (Addendum)
 NEUROLOGY CONSULT NOTE   Date of service: November 04, 2023 Patient Name: Heidi Acevedo MRN:  [id number] DOB:  Dec 14, 1970 Chief Complaint: Right sided numbness Requesting Provider: Malvina Alm DASEN, MD  History of Present Illness  Heidi Acevedo is a 53 y.o. female with a PMHx of arthritic left knee pain and morbid obesity who presents to the ED with acute onset of right face, arm and leg paresthesias. She noticed the symptoms when she stood up out of her seat at 1825. She has no history of stroke or MI. She denies any speech deficits, headache, vision changes, facial droop, arm weakness, leg weakness, dysarthria, SOB or CP. She does endorse some arthritic left knee pain and bilateral ankle swelling. All other systems negative. Code Stroke was called in the ED.   LKW: 1825 Estimated time seen by Neurology: 7:15 PM Modified rankin score: 0-Completely asymptomatic and back to baseline post- stroke IV Thrombolysis: No; symptoms too mild to treat. Giveway weakness and inconsistencies noted on exam. Benefits of TNK are significantly outweighed by the risks EVT:  No; no LVO   NIHSS components Score: Comment  1a Level of Conscious 0[x]  1[]  2[]  3[]      1b LOC Questions 0[x]  1[]  2[]       1c LOC Commands 0[x]  1[]  2[]       2 Best Gaze 0[x]  1[]  2[]       3 Visual 0[x]  1[]  2[]  3[]      4 Facial Palsy 0[x]  1[]  2[]  3[]      5a Motor Arm - left 0[x]  1[]  2[]  3[]  4[]  UN[]    5b Motor Arm - Right 0[x]  1[]  2[]  3[]  4[]  UN[]    6a Motor Leg - Left 0[x]  1[]  2[]  3[]  4[]  UN[]    6b Motor Leg - Right 0[x]  1[]  2[]  3[]  4[]  UN[]    7 Limb Ataxia 0[x]  1[]  2[]  3[]  UN[]     8 Sensory 0[]  1[x]  2[]  UN[]      9 Best Language 0[x]  1[]  2[]  3[]      10 Dysarthria 0[x]  1[]  2[]  UN[]      11 Extinct. and Inattention 0[]  1[x]  2[]       TOTAL:   2      ROS  Comprehensive ROS performed and pertinent positives documented in HPI    Past History  No past medical history on file.  Past Surgical History:  Procedure Laterality Date    ECTOPIC PREGNANCY SURGERY      Family History: No family history on file.  Social History  reports that she has never smoked. She has never used smokeless tobacco. She reports that she does not drink alcohol and does not use drugs.  No Known Allergies  Medications  No current facility-administered medications for this encounter.  Current Outpatient Medications:    ibuprofen  (ADVIL ) 800 MG tablet, Take 1 tablet (800 mg total) by mouth every 8 (eight) hours as needed., Disp: 30 tablet, Rfl: 0   methocarbamol  (ROBAXIN ) 500 MG tablet, Take 1 tablet (500 mg total) by mouth every 8 (eight) hours as needed., Disp: 15 tablet, Rfl: 0  Vitals   Vitals:   11/04/23 1906 11/04/23 1907 11/04/23 1908 11/04/23 1915  BP: (!) 159/102     Pulse:   78   Resp:    20  Temp:    98 F (36.7 C)  TempSrc:    Oral  SpO2:   100%   Weight:  129.7 kg      Body mass index is 42.22 kg/m.  Physical Exam   Physical Exam  HEENT- [place]/AT. Palpable mass along left lateral aspect of neck that is tender to palpation Lungs- Respirations unlabored Extremities- Warm and well-perfused  Neurological Examination Mental Status: Awake and alert. Fully oriented. Thought content appropriate.  Speech fluent without evidence of aphasia.  Able to follow all commands without difficulty. Cranial Nerves: II: Temporal visual fields intact with no extinction to DSS. PERRL. III,IV, VI: No ptosis. EOMI. No nystagmus. V: Subjectively decreased temp and FT sensation to right side of face. No extinction to DSS VII: Smile symmetric VIII: Hearing intact to voice IX,X: No hypophonia or hoarseness XI: Symmetric XII: Midline tongue extension Motor: RUE: with no drift, 4/5 deltoid, biceps, triceps with giveway weakness. 4+/5 grip. Negative orbiting fingers test.  LUE: 5/5. No drift RLE: 4+/5 with giveway weakness, except for ADF which is 5/5. No drift LLE: 5/5. No drift Sensory: Subjectively decreased temp sensation to RUE,  normal temp sensation to LUE and BLE.  Subjectively decreased FT sensation to RUE and RLE.  On some trials, there is extinction to touch to right upper extremity.  No extinction to DSS with testing of BLE.  Deep Tendon Reflexes:  Unable to elicit patellar reflexes in the context of adiposity. 0 bilateral ankle reflexes. 1+ bilateral brachioradialis.  Plantars: Right: downgoingLeft: downgoing Cerebellar: No ataxia with FNF bilaterally Gait: Deferred   Labs/Imaging/Neurodiagnostic studies   CBC:  Recent Labs  Lab 2023/11/27 1850  WBC 12.1*  NEUTROABS 7.7  HGB 11.9*  HCT 37.0  MCV 88.1  PLT 386   Basic Metabolic Panel:  Lab Results  Component Value Date   NA 138 November 27, 2023   K 4.0 11-27-23   CO2 25 11/27/23   GLUCOSE 110 (H) 11-27-23   BUN 11 11/27/23   CREATININE 0.83 2023/11/27   CALCIUM  8.7 (L) 11-27-2023   GFRNONAA >60 2023-11-27   GFRAA >60 11/30/2018     ASSESSMENT  53 y.o. female with a PMHx of arthritic left knee pain and morbid obesity who presents to the ED with acute onset of right face, arm and leg paresthesias. She noticed the symptoms when she stood up out of her seat at 1825. She has no history of stroke or MI. She denies any speech deficits, vision changes, facial droop, arm weakness, leg weakness, dysarthria, SOB or CP. She does endorse some arthritic left knee pain and bilateral ankle swelling. All other systems negative. Code Stroke was called in the ED.  - Exam reveals giveway weakness on the right with no drift, negative orbiting fingers test, normal ankle dorsiflexion strength bilaterally, normal tone in all 4 extremities, no facial droop and normal shoulder shrug bilaterally. However, RLE is consistently slightly externally rotated at rest.  - CT head: No acute intracranial abnormality. Aspects is 10. Mild chronic microvascular ischemic disease. - Repeat exam at 8:30 shows no change to her mild right sided weakness, but with mild right facial droop  with slightly decreased movement of right perioral region when talking and slightly decreased right NL fold at rest. Continues to have right sided extinction to DSS. Family now present in room. They state that right side of face seems slightly weak. There is question of some new dysarthria based on examiner's ear, but family states she is not dysarthric.  - Suspect possible acute left thalamic lacunar infarction versus acute capsular warning syndrome versus acute small left parietal lobe stroke - On detailed questioning of patient regarding contraindications, she has no absolute contraindications to TNK.  - The patient is an IV thrombolysis candidate. Discussed  extensively the risks/benefits of IV thrombolysis treatment vs. no treatment with the patient and her family, including risks of hemorrhage and death with IV thrombolysis administration versus worse overall outcomes on average in patients within thrombolysis time window who are not administered TNK. Overall benefits of TNK regarding long-term prognosis are felt to outweigh risks, but not by a wide margin. The patient and family have stated emphatically their wish to have further imaging done prior to making a decision regarding TNK. When the imaging options were outlined to them, they stated that they would like to have both MRI and CTA done prior to decision making. Patient and family have been advised of possible worse outcomes with TNK treatment delay and have reiterated that they wish to proceed with imaging prior to making decision to treat or not to treat with TNK.    RECOMMENDATIONS  - STAT CTA of head and neck has been ordered.  - STAT MRI brain has been ordered - Will discuss with patient and family TNK versus no treatment after STAT imaging results.   Addendum: - CTA of head and neck: No LVO per verbal report from Radiology - MRI brain: Preliminary review reveals no acute infarction. Mild chronic small vessel ischemic changes are noted.   - Imaging results discussed with family and patient. Patient states that she has made the decision to decline TNK. Family expressed agreement with her decision.  - Admit to Hospitalist for full stroke work up to include TTE and cardiac telemetry, PT/OT/Speech.  - HgbA1c, fasting lipid panel - ASA 325 mg po x 1 now, then 81 mg po every day - Risk factor modification to include daily light exercise, diet and BP management - May have undiagnosed HTN - will need work up - May have undiagnosed DM - will need work up - Frequent neuro checks - Permissive HTN x 24 hours - Statin. Obtain baseline CK level.  - NPO until passes stroke swallow screen - She has a palpable mass along left lateral aspect of neck that is tender to palpation. Will need full work up for this abnormality including CT imaging. May need needle aspiration. May also need CXR.   ______________________________________________________________________    Bonney SHARK, Nur Rabold, MD [facility]

## 2023-11-04 NOTE — ED Provider Notes (Signed)
 [facility] Provider Note    Event Date/Time   First MD Initiated Contact with Patient 11/04/23 1904     (approximate)   History   Numbness   HPI Heidi Acevedo is a 52 y.o. female presenting today for numbness.  Patient states approximately 20 minutes for arrival at 6:25 PM she had onset of numbness to the right side of her face, right arm, and right leg.  Right leg symptoms have resolved but still having ongoing numbness along her right arm and some paresthesias to the right side of her face.  Otherwise no vision changes, speech changes, weakness, facial droop.  No prior history of stroke.  No other recent symptoms.     Physical Exam   Triage Vital Signs: ED Triage Vitals  Encounter Vitals Group     BP 11/04/23 1839 (!) 158/95     Systolic BP Percentile --      Diastolic BP Percentile --      Pulse Rate 11/04/23 1838 79     Resp 11/04/23 1838 17     Temp 11/04/23 1838 97.9 F (36.6 C)     Temp Source 11/04/23 1838 Oral     SpO2 11/04/23 1838 96 %     Weight 11/04/23 1907 285 lb 14.4 oz (129.7 kg)     Height --      Head Circumference --      Peak Flow --      Pain Score --      Pain Loc --      Pain Education --      Exclude from Growth Chart --     Most recent vital signs: Vitals:   11/04/23 2100 11/04/23 2235  BP: (!) 164/86 (!) 185/92  Pulse: 68 72  Resp: 16 14  Temp:    SpO2: 99% 99%   Physical Exam: I have reviewed the vital signs and nursing notes. General: Awake, alert, no acute distress.  Nontoxic appearing. Head:  Atraumatic, normocephalic.   ENT:  EOM intact, PERRL. Oral mucosa is pink and moist with no lesions. Neck: Neck is supple with full range of motion, No meningeal signs. Cardiovascular:  RRR, No murmurs. Peripheral pulses palpable and equal bilaterally. Respiratory:  Symmetrical chest wall expansion.  No rhonchi, rales, or wheezes.  Good air movement throughout.  No use of accessory muscles.   Musculoskeletal:   No cyanosis or edema. Moving extremities with full ROM Abdomen:  Soft, nontender, nondistended. Neuro:  GCS 15, moving all four extremities, interacting appropriately. Alert and oriented with cogent speech; 5/5 motor strength all 4 extremities with intact peripheral nerve distributions; sensory changes to the right side of her face, and right arm in comparison to the left side, cerebellar examination normal including Romberg, finger-to-nose, heel-to-shin, and without trunkal ataxia, dysdiadochokinesis or dysmetria; cranial nerves 2-12 intact to gross challenge bilaterally. Psych:  Calm, appropriate.   Skin:  Warm, dry, no rash.    ED Results / Procedures / Treatments   Labs (all labs ordered are listed, but only abnormal results are displayed) Labs Reviewed  COMPREHENSIVE METABOLIC PANEL - Abnormal; Notable for the following components:      Result Value   Glucose, Bld 110 (*)    Calcium  8.7 (*)    All other components within normal limits  CBC WITH DIFFERENTIAL/PLATELET - Abnormal; Notable for the following components:   WBC 12.1 (*)    Hemoglobin 11.9 (*)    All other components within normal limits  CBG MONITORING, ED - Abnormal; Notable for the following components:   Glucose-Capillary 104 (*)    All other components within normal limits  MAGNESIUM  URINE DRUG SCREEN, QUALITATIVE ([facility] ONLY)  URINALYSIS, ROUTINE W REFLEX MICROSCOPIC  PROTIME-INR  APTT  ETHANOL  HEMOGLOBIN A1C  LIPID PANEL  POC URINE PREG, ED     EKG My EKG interpretation: Rate of 76, normal sinus rhythm, normal axis, normal intervals.  No acute ST elevations or depressions   RADIOLOGY Independently interpreted CT head with no acute pathology   PROCEDURES:  Critical Care performed: Yes, see critical care procedure note(s)  .Critical Care  Performed by: Malvina Alm DASEN, MD Authorized by: Malvina Alm DASEN, MD   Critical care provider statement:    Critical care time (minutes):  30   Critical care  was necessary to treat or prevent imminent or life-threatening deterioration of the following conditions: Stroke.   Critical care was time spent personally by me on the following activities:  Development of treatment plan with patient or surrogate, discussions with consultants, evaluation of patient's response to treatment, examination of patient, ordering and review of laboratory studies, ordering and review of radiographic studies, ordering and performing treatments and interventions, pulse oximetry, re-evaluation of patient's condition and review of old charts   I assumed direction of critical care for this patient from another provider in my specialty: no     Care discussed with: admitting provider      MEDICATIONS ORDERED IN ED: Medications   stroke: early stages of recovery book (has no administration in time range)  aspirin  EC tablet 650 mg (has no administration in time range)  aspirin  EC tablet 81 mg (has no administration in time range)  iohexol  (OMNIPAQUE ) 350 MG/ML injection 100 mL (100 mLs Intravenous Contrast Given 11/04/23 2120)     IMPRESSION / MDM / ASSESSMENT AND PLAN / ED COURSE  I reviewed the triage vital signs and the nursing notes.                              Differential diagnosis includes, but is not limited to, CVA, paresthesias, peripheral nerve neuropathy  Patient's presentation is most consistent with acute presentation with potential threat to life or bodily function.  Patient is a 53 year old female presenting today for acute onset numbness to the right side of her face, right arm, and right leg.  At the time of my assessment most of the numbness is in the right arm with slight paresthesias to the right side of her face.  No other focal neurological deficits.  NIHSS of 2.  Stroke alert was called as patient was in the first 30 minutes of symptom onset.  CT head showed no acute pathology.  Patient accidentally evaluated by 2 different neurologist.  CTA head and  neck was ordered which showed no acute pathology.  Dr. Lindzen with neurology discussed with family TNK.  Family made the decision to decline TNK.  MRI brain ordered which shows no acute infarct.  Neurology recommending admission to hospitalist for further stroke workup.  Patient admitted to hospitalist for further care.  The patient is on the cardiac monitor to evaluate for evidence of arrhythmia and/or significant heart rate changes. Clinical Course as of 11/04/23 2327  Mon Nov 04, 2023  1949 Dr. Gar (neurology) - recommends aspirin  plavix , statin, permissive HTN, MRI, CTA angio/head. [DW]  2135 Error occurred that 2 neurologists evaluated the patient. Dr. Lindzen  saw patient as well. Initially noticed weakness. Ordered stat CTA head/neck and possible TNK discussion with family. They wanted to withhold at this time. Stat MRI ordered. Additional work up pending MRI. [DW]  2327 Admit by Dr. Tobie [DW]    Clinical Course User Index [DW] Malvina Alm DASEN, MD     FINAL CLINICAL IMPRESSION(S) / ED DIAGNOSES   Final diagnoses:  Right arm numbness  Numbness and tingling of right face     Rx / DC Orders   ED Discharge Orders     None        Note:  This document was prepared using Dragon voice recognition software and may include unintentional dictation errors.   Malvina Alm DASEN, MD 11/04/23 [phone]

## 2023-11-04 NOTE — ED Notes (Signed)
 Pt's family member stated that pt hasn't eaten in a while. This tech asked the pt if she was hungry and she said she was. This tech brought the pt a sandwich tray and grape juice. Pt had no other requests at this time.

## 2023-11-04 NOTE — ED Notes (Signed)
 Patient transported to CT

## 2023-11-04 NOTE — ED Triage Notes (Signed)
 Pt reports sudden onset of numbness/tingling to R side of face, R arm, and R leg when she stood up out of her seat at 1825. Pt has equal grip strength and face is symmetrical. Speech is clear, no blurred or loss of vision. Pt reports she has felt normal otherwise, all day.

## 2023-11-04 NOTE — ED Notes (Signed)
 Code stroke  called to  [facility]  at  6:54pm

## 2023-11-05 ENCOUNTER — Inpatient Hospital Stay
Admit: 2023-11-05 | Discharge: 2023-11-05 | Disposition: A | Discharge status: Home / Self Care | Payer: BLUE CROSS/BLUE SHIELD | Attending: Neurology | Admitting: Neurology

## 2023-11-05 DIAGNOSIS — M1712 Unilateral primary osteoarthritis, left knee: Secondary | ICD-10-CM

## 2023-11-05 DIAGNOSIS — R29818 Other symptoms and signs involving the nervous system: Secondary | ICD-10-CM | POA: Diagnosis present

## 2023-11-05 DIAGNOSIS — R202 Paresthesia of skin: Secondary | ICD-10-CM

## 2023-11-05 DIAGNOSIS — E785 Hyperlipidemia, unspecified: Secondary | ICD-10-CM | POA: Insufficient documentation

## 2023-11-05 DIAGNOSIS — R7303 Prediabetes: Secondary | ICD-10-CM

## 2023-11-05 DIAGNOSIS — R590 Localized enlarged lymph nodes: Secondary | ICD-10-CM | POA: Insufficient documentation

## 2023-11-05 DIAGNOSIS — R200 Anesthesia of skin: Principal | ICD-10-CM

## 2023-11-05 DIAGNOSIS — G459 Transient cerebral ischemic attack, unspecified: Secondary | ICD-10-CM

## 2023-11-05 LAB — ECHOCARDIOGRAM COMPLETE
AR max vel: 2.48 cm2
AV Area VTI: 2.87 cm2
AV Area mean vel: 2.6 cm2
AV Mean grad: 3 mm[Hg]
AV Peak grad: 7.6 mm[Hg]
Ao pk vel: 1.38 m/s
Area-P 1/2: 4.12 cm2
Calc EF: 59.9 %
MV VTI: 2.19 cm2
S' Lateral: 3.1 cm
Single Plane A2C EF: 60.3 %
Single Plane A4C EF: 61.2 %
Weight: 4574.4 [oz_av]

## 2023-11-05 LAB — PREGNANCY, URINE: Preg Test, Ur: NEGATIVE

## 2023-11-05 LAB — HIV ANTIBODY (ROUTINE TESTING W REFLEX): HIV Screen 4th Generation wRfx: NONREACTIVE

## 2023-11-05 MED ORDER — ASPIRIN 81 MG PO TBEC: 81.0000 mg | DELAYED_RELEASE_TABLET | Freq: Every day | ORAL | Status: DC

## 2023-11-05 MED ORDER — ASPIRIN 325 MG PO TABS: 325.0000 mg | ORAL_TABLET | Freq: Every day | ORAL | Status: DC

## 2023-11-05 MED ORDER — CLOPIDOGREL BISULFATE 75 MG PO TABS
75.0000 mg | ORAL_TABLET | Freq: Every day | ORAL | Status: DC
  Administered 2023-11-05: 75 mg via ORAL
  Filled 2023-11-05: qty 1

## 2023-11-05 MED ORDER — METHOCARBAMOL 500 MG PO TABS: 500.0000 mg | ORAL_TABLET | Freq: Three times a day (TID) | ORAL | Status: DC | PRN

## 2023-11-05 MED ORDER — LOSARTAN POTASSIUM 50 MG PO TABS: 50.0000 mg | ORAL_TABLET | Freq: Every day | ORAL | 1 refills | Status: AC

## 2023-11-05 MED ORDER — ACETAMINOPHEN 325 MG RE SUPP: 650.0000 mg | RECTAL | Status: DC | PRN

## 2023-11-05 MED ORDER — ENOXAPARIN SODIUM 80 MG/0.8ML IJ SOSY
65.0000 mg | PREFILLED_SYRINGE | INTRAMUSCULAR | Status: DC
  Filled 2023-11-05: qty 0.65

## 2023-11-05 MED ORDER — ROSUVASTATIN CALCIUM 20 MG PO TABS: 20.0000 mg | ORAL_TABLET | Freq: Every day | ORAL | 1 refills | Status: AC

## 2023-11-05 MED ORDER — LOSARTAN POTASSIUM 50 MG PO TABS
50.0000 mg | ORAL_TABLET | Freq: Every day | ORAL | Status: DC
  Administered 2023-11-05: 50 mg via ORAL
  Filled 2023-11-05: qty 1

## 2023-11-05 MED ORDER — ROSUVASTATIN CALCIUM 20 MG PO TABS
40.0000 mg | ORAL_TABLET | Freq: Every day | ORAL | Status: DC
Start: 2023-11-05 — End: 2023-11-05
  Administered 2023-11-05: 40 mg via ORAL
  Filled 2023-11-05: qty 2

## 2023-11-05 MED ORDER — ASPIRIN 81 MG PO TBEC: 81.0000 mg | DELAYED_RELEASE_TABLET | Freq: Every day | ORAL | 12 refills | Status: AC

## 2023-11-05 MED ORDER — ACETAMINOPHEN 325 MG PO TABS: 650.0000 mg | ORAL_TABLET | ORAL | Status: DC | PRN

## 2023-11-05 MED ORDER — ACETAMINOPHEN 160 MG/5ML PO SOLN: 650.0000 mg | ORAL | Status: DC | PRN

## 2023-11-05 MED ORDER — ASPIRIN 300 MG RE SUPP: 300.0000 mg | Freq: Every day | RECTAL | Status: DC

## 2023-11-05 MED ORDER — STROKE: EARLY STAGES OF RECOVERY BOOK: Freq: Once | Status: DC

## 2023-11-05 NOTE — Hospital Course (Addendum)
 Taken from H&P.   Heidi Acevedo is a 53 y.o. female with medical history significant for Osteoarthritis of the knee currently receiving physical therapy, morbid obesity, HLD, prediabetes and eczema, who presents to the ED as a code stroke after presenting with sudden onset right-sided numbness and tingling of the face, arm and leg starting at 1825 on 11/03/2022.   She was seen in consultation initially by teleneuro and subsequently by neurologist, Dr. Lindzen.  She was not a tPA candidate due to NIH of 0 and symptoms to mild to treat. Initial CT head was negative for any acute abnormality. CTA head and neck showed no LVO MRI brain revealed no acute infarct.  Mild chronic small vessel ischemic changes. Initial vitals with elevated blood pressure at 185/92 and labs with mild leukocytosis at 12, rest mostly unremarkable.  Admitted for stroke workup.  1/7; blood pressure elevated at 152/88.  Lipid panel with elevated total cholesterol at 214 and LDL at 141, UA and UDS negative.  A1c pending Started on Crestor  and losartan , does need to be titrated by PCP PT and OT evaluation without any recommendations as patient is at baseline, all deficits has been resolved.  No stroke but TIA is a possibility or symptoms due to undiagnosed hypertension.  Echocardiogram without any abnormality. Discussed with neurology and patient is being discharged on baby aspirin , Crestor  and losartan .  Patient will continue on current medications and need to have a close follow-up with her PCP for further management.

## 2023-11-05 NOTE — Evaluation (Signed)
 Physical Therapy Evaluation Patient Details Name: Heidi Acevedo MRN: [id number] DOB: 06-18-1971 Today's Date: 11/05/2023  History of Present Illness  presented to ER secondary to acute onset of R-sided numbness/tingling to face, UE/LE; admitted for TIA/CVA work-up. Imaging negative for acute insult; symptoms now fully resolved.  Clinical Impression  Patient resting on stretcher upon arrival to ED room; supportive husband at bedside.  Patient alert and oriented, follows commands and agreeable to participation with session.  Denies pain.  Endorses full resolution of symptoms, except for mild paresthesia in fingertips 1-3 (which she attributes to baseline CTS).  Bilat UE/LE strength and ROM grossly symmetrical and WFL; no focal weakness appreciated. Able to complete bed mobility with indep; sit/stand, basic transfers and gait (125') without assist device, mod indep.  Demonstrates reciprocal stepping pattern wtih good step height/length, good trunk rotation and arm swing; completes head turns, changes of direction and start/stop without difficulty, LOB or safety concern.  Completes higher-level balance activities (functional reach, retrieving item from floor, toe taps on step) without difficulty. Appears to be at functional baseline with no acute PT needs identified.  Will complete initial order at this time; please reconsult should needs change.        If plan is discharge home, recommend the following:     Can travel by private vehicle        Equipment Recommendations    Recommendations for Other Services       Functional Status Assessment       Precautions / Restrictions Precautions Precautions: None Restrictions Weight Bearing Restrictions Per Provider Order: No      Mobility  Bed Mobility     Rolling: Independent              Transfers Overall transfer level: Modified independent Equipment used: None                    Ambulation/Gait Ambulation/Gait  assistance: Modified independent (Device/Increase time) Gait Distance (Feet): 125 Feet Assistive device: None         General Gait Details: reciprocal stepping pattern wtih good step height/length, good trunk rotation and arm swing; completes head turns, changes of direction and start/stop without difficulty, LOB or safety concern  Stairs            Wheelchair Mobility     Tilt Bed    Modified Rankin (Stroke Patients Only)       Balance                                             Pertinent Vitals/Pain Pain Assessment Pain Assessment: No/denies pain    Home Living   Living Arrangements: Spouse/significant other               Home Equipment: None Additional Comments: Lives in [place] apartment    Prior Function                       Extremity/Trunk Assessment                Communication   Communication Communication: No apparent difficulties  Cognition  General Comments General comments (skin integrity, edema, etc.): Functional reach at least 6 from immediate BOS; safely/easily retrieves item from floor and completes alternate LE tapping on step (x8) with no greater than sup level of assist.    Exercises     Assessment/Plan    PT Assessment    PT Problem List         PT Treatment Interventions      PT Goals (Current goals can be found in the Care Plan section)       Frequency       Co-evaluation               AM-PAC PT 6 Clicks Mobility  Outcome Measure Help needed turning from your back to your side while in a flat bed without using bedrails?: None Help needed moving from lying on your back to sitting on the side of a flat bed without using bedrails?: None Help needed moving to and from a bed to a chair (including a wheelchair)?: None Help needed standing up from a chair using your arms (e.g., wheelchair or bedside  chair)?: None Help needed to walk in hospital room?: None Help needed climbing 3-5 steps with a railing? : None 6 Click Score: 24    End of Session   Activity Tolerance: Patient tolerated treatment well Patient left: in bed;with family/visitor present   PT Visit Diagnosis: Difficulty in walking, not elsewhere classified (R26.2)    Time: 1055-1110 PT Time Calculation (min) (ACUTE ONLY): 15 min   Charges:   PT Evaluation $PT Eval Low Complexity: 1 Low   PT General Charges $$ ACUTE PT VISIT: 1 Visit       Letisia Schwalb H. Delores, PT, DPT, NCS 11/05/23, 4:12 PM [phone]

## 2023-11-05 NOTE — Assessment & Plan Note (Signed)
 Continue Robaxin and as needed ibuprofen

## 2023-11-05 NOTE — Assessment & Plan Note (Addendum)
 Suspect TIA MRI brain without acute infarct showing chronic small vessel ischemic changes, CTA without LVO Permissive hypertension for first 24-48 hrs post stroke onset: Prn Labetalol IV or Vasotec IV If BP greater than 220/120  Statins for LDL goal less than 70 ASA 81mg  daily, Plavix  75mg  daily x 3 weeks then monotherapy thereafter Telemetry, echo Avoid dextrose  containing fluids, Maintain euglycemia, euthermia Neuro checks q4 hrs x 24 hrs and then per shift Head of bed 30 degrees Physical therapy/Occupational therapy/Speech therapy if failed dysphagia screen Neurology consult to follow

## 2023-11-05 NOTE — H&P (Signed)
 History and Physical    Patient: Heidi Acevedo DOB: [birth year] DOA: 11/04/2023 DOS: the patient was seen and examined on 11/05/2023 PCP: Johanne Quinten SQUIBB, MD  Patient coming from: Home  Chief Complaint:  Chief Complaint  Patient presents with   Numbness    HPI: Heidi Acevedo is a 53 y.o. female with medical history significant for Osteoarthritis of the knee currently receiving physical therapy, morbid obesity, HLD, prediabetes and eczema, who presents to the ED as a code stroke after presenting with sudden onset right-sided numbness and tingling of the face, arm and leg starting at 1825 on 11/03/2022.  She denied headache, visual disturbance or problems with speech.  Denies weakness in the extremities.  She was previously in her usual state of health except for a tender left lymph node under left jaw that she noticed two days prior.  No respiratory symptoms or dental infection Initial CT head negative for acute bleed. She was seen in consultation initially by teleneuro and subsequently by neurologist, Dr. Lindzen.  She was not a tPA candidate due to NIH of 0 and symptoms to mild to treat. CTA head and neck showed no LVO MRI brain revealed no acute infarct.  Mild chronic small vessel ischemic changes. Additional data review: Has BP of 185/92 with otherwise normal vitals UDS negative CBC with mild leukocytosis of 12 and hemoglobin 11.9 CMP unremarkable  Admission requested for stroke workup   Review of Systems: As mentioned in the history of present illness. All other systems reviewed and are negative.  No past medical history on file. Past Surgical History:  Procedure Laterality Date   ECTOPIC PREGNANCY SURGERY     Social History:  reports that she has never smoked. She has never used smokeless tobacco. She reports that she does not drink alcohol and does not use drugs.  No Known Allergies  No family history on file.  Prior to Admission medications   Medication  Sig Start Date End Date Taking? Authorizing Provider  ibuprofen  (ADVIL ) 800 MG tablet Take 1 tablet (800 mg total) by mouth every 8 (eight) hours as needed. 10/20/23   Ward, Josette SAILOR, DO  methocarbamol  (ROBAXIN ) 500 MG tablet Take 1 tablet (500 mg total) by mouth every 8 (eight) hours as needed. 10/20/23   Ward, Josette SAILOR, DO    Physical Exam: Vitals:   11/04/23 1908 11/04/23 1915 11/04/23 2100 11/04/23 2235  BP:   (!) 164/86 (!) 185/92  Pulse: 78  68 72  Resp:  20 16 14   Temp:  98 F (36.7 C)    TempSrc:  Oral    SpO2: 100%  99% 99%  Weight:       Physical Exam Vitals and nursing note reviewed.  Constitutional:      General: She is not in acute distress. HENT:     Head: Normocephalic and atraumatic.  Neck:     Comments: Tender left cervical lymphadenopathy Cardiovascular:     Rate and Rhythm: Normal rate and regular rhythm.     Heart sounds: Normal heart sounds.  Pulmonary:     Effort: Pulmonary effort is normal.     Breath sounds: Normal breath sounds.  Abdominal:     Palpations: Abdomen is soft.     Tenderness: There is no abdominal tenderness.  Neurological:     Mental Status: Mental status is at baseline.     Labs on Admission: I have personally reviewed following labs and imaging studies  CBC: Recent Labs  Lab 11/04/23 1850  WBC 12.1*  NEUTROABS 7.7  HGB 11.9*  HCT 37.0  MCV 88.1  PLT 386   Basic Metabolic Panel: Recent Labs  Lab 11/04/23 1850  NA 138  K 4.0  CL 103  CO2 25  GLUCOSE 110*  BUN 11  CREATININE 0.83  CALCIUM  8.7*  MG 2.1   GFR: Estimated Creatinine Clearance: 112 mL/min (by C-G formula based on SCr of 0.83 mg/dL). Liver Function Tests: Recent Labs  Lab 11/04/23 1850  AST 19  ALT 20  ALKPHOS 91  BILITOT 0.6  PROT 7.8  ALBUMIN 3.8   No results for input(s): LIPASE, AMYLASE in the last 168 hours. No results for input(s): AMMONIA in the last 168 hours. Coagulation Profile: Recent Labs  Lab 11/04/23 2302  INR  1.1   Cardiac Enzymes: No results for input(s): CKTOTAL, CKMB, CKMBINDEX, TROPONINI in the last 168 hours. BNP (last 3 results) No results for input(s): PROBNP in the last 8760 hours. HbA1C: No results for input(s): HGBA1C in the last 72 hours. CBG: Recent Labs  Lab 11/04/23 1906  GLUCAP 104*   Lipid Profile: Recent Labs    11/04/23 2302  CHOL 214*  HDL 54  LDLCALC 141*  TRIG 93  CHOLHDL 4.0   Thyroid Function Tests: No results for input(s): TSH, T4TOTAL, FREET4, T3FREE, THYROIDAB in the last 72 hours. Anemia Panel: No results for input(s): VITAMINB12, FOLATE, FERRITIN, TIBC, IRON, RETICCTPCT in the last 72 hours. Urine analysis:    Component Value Date/Time   COLORURINE YELLOW (A) 11/04/2023 2302   APPEARANCEUR CLOUDY (A) 11/04/2023 2302   APPEARANCEUR Clear 08/27/2014 0013   LABSPEC >1.046 (H) 11/04/2023 2302   LABSPEC 1.028 08/27/2014 0013   PHURINE 5.0 11/04/2023 2302   GLUCOSEU NEGATIVE 11/04/2023 2302   GLUCOSEU Negative 08/27/2014 0013   HGBUR NEGATIVE 11/04/2023 2302   BILIRUBINUR NEGATIVE 11/04/2023 2302   BILIRUBINUR Negative 08/27/2014 0013   KETONESUR NEGATIVE 11/04/2023 2302   PROTEINUR NEGATIVE 11/04/2023 2302   UROBILINOGEN 1.0 06/01/2012 1901   NITRITE NEGATIVE 11/04/2023 2302   LEUKOCYTESUR NEGATIVE 11/04/2023 2302   LEUKOCYTESUR Negative 08/27/2014 0013    Radiological Exams on Admission: MR BRAIN WO CONTRAST Result Date: 11/04/2023 CLINICAL DATA:  Initial evaluation for acute neuro deficit, stroke suspected, right-sided numbness. EXAM: MRI HEAD WITHOUT CONTRAST TECHNIQUE: Multiplanar, multiecho pulse sequences of the brain and surrounding structures were obtained without intravenous contrast. COMPARISON:  Prior CTs from earlier the same day. FINDINGS: Brain: Cerebral volume within normal limits. Patchy T2/FLAIR hyperintensity seen involving the deep and subcortical white matter of both cerebral hemispheres,  nonspecific, but most commonly related to chronic microvascular ischemic disease. Changes are moderate in nature. No evidence for acute or subacute ischemia. Gray-white matter differentiation maintained. No areas of chronic cortical infarction. No acute or chronic intracranial blood products. No mass lesion, midline shift or mass effect. No hydrocephalus or extra-axial fluid collection. Empty sella noted. Suprasellar region normal. Vascular: Major intracranial vascular flow voids are maintained. Skull and upper cervical spine: Craniocervical junction normal. Bone marrow signal intensity overall within normal limits. No scalp soft tissue abnormality. Sinuses/Orbits: Globes and orbital soft tissues within normal limits. Paranasal sinuses are clear. No significant mastoid effusion. Other: None. IMPRESSION: 1. No acute intracranial infarct or other abnormality. 2. Moderate cerebral white matter disease, nonspecific, but most commonly related to chronic microvascular ischemic disease. Electronically Signed   By: Morene Hoard M.D.   On: 11/04/2023 22:57   CT ANGIO HEAD NECK W WO CM (CODE STROKE) Result Date:  11/04/2023 CLINICAL DATA:  Initial evaluation for acute neuro deficit, stroke suspected. Right-sided deficits. EXAM: CT ANGIOGRAPHY HEAD AND NECK WITH AND WITHOUT CONTRAST TECHNIQUE: Multidetector CT imaging of the head and neck was performed using the standard protocol during bolus administration of intravenous contrast. Multiplanar CT image reconstructions and MIPs were obtained to evaluate the vascular anatomy. Carotid stenosis measurements (when applicable) are obtained utilizing NASCET criteria, using the distal internal carotid diameter as the denominator. RADIATION DOSE REDUCTION: This exam was performed according to the departmental dose-optimization program which includes automated exposure control, adjustment of the mA and/or kV according to patient size and/or use of iterative reconstruction  technique. CONTRAST:  OMNIPAQUE  IOHEXOL  350 MG/ML SOLN COMPARISON:  CT from earlier the same day. FINDINGS: CTA NECK FINDINGS Aortic arch: Standard branching. Imaged portion shows no evidence of aneurysm or dissection. No significant stenosis of the major arch vessel origins. Right carotid system: No evidence of dissection, stenosis (50% or greater), or occlusion. Left carotid system: No evidence of dissection, stenosis (50% or greater), or occlusion. Vertebral arteries: Both vertebral arteries arise from subclavian arteries. Proximal aspects of the vertebral arteries not well visualized due to adjacent venous contamination. Visualized portions of the vertebral arteries patent without stenosis or dissection. Skeleton: No discrete or worrisome osseous lesions. Other neck: No other acute finding. Upper chest: No other acute finding. Review of the MIP images confirms the above findings CTA HEAD FINDINGS Anterior circulation: Mild atheromatous change about the carotid siphons without significant stenosis. A1 segments, anterior communicating artery complex common anterior cerebral arteries patent without stenosis. No M1 stenosis or occlusion. No proximal MCA branch occlusion or high-grade stenosis. Distal MCA branches perfused and symmetric. Posterior circulation: Both V4 segments patent without significant stenosis. Both PICA patent at their origins. Basilar patent without stenosis. Superior cerebellar and posterior cerebral arteries patent bilaterally. Venous sinuses: Grossly patent allowing for timing the contrast bolus. Anatomic variants: None significant.  No aneurysm. Review of the MIP images confirms the above findings IMPRESSION: 1. Negative CTA of the head and neck. No large vessel occlusion or other emergent finding. 2. Mild atheromatous change about the carotid siphons without significant stenosis. Electronically Signed   By: Morene Hoard M.D.   On: 11/04/2023 21:39   CT HEAD CODE STROKE WO  CONTRAST Result Date: 11/04/2023 CLINICAL DATA:  Code stroke. Initial evaluation for acute neuro deficit, stroke suspected, right-sided numbness. EXAM: CT HEAD WITHOUT CONTRAST TECHNIQUE: Contiguous axial images were obtained from the base of the skull through the vertex without intravenous contrast. RADIATION DOSE REDUCTION: This exam was performed according to the departmental dose-optimization program which includes automated exposure control, adjustment of the mA and/or kV according to patient size and/or use of iterative reconstruction technique. COMPARISON:  None Available. FINDINGS: Brain: Cerebral volume within normal limits. Scattered patchy hypodensity involving the supratentorial cerebral white matter, likely related to chronic microvascular ischemic disease, mild in nature. No acute intracranial hemorrhage. No acute large vessel territory infarct. No mass lesion or midline shift. No hydrocephalus or extra-axial fluid collection. Empty sella noted. Vascular: No abnormal hyperdense vessel. Skull: Scalp soft tissues within normal limits.  Calvarium intact. Sinuses/Orbits: Globes and orbital soft tissues within normal limits. Paranasal sinuses are in clear. No mastoid effusion. Other: None. ASPECTS [facility] Stroke Program Early CT Score) - Ganglionic level infarction (caudate, lentiform nuclei, internal capsule, insula, M1-M3 cortex): 7 - Supraganglionic infarction (M4-M6 cortex): 3 Total score (0-10 with 10 being normal): 10 IMPRESSION: 1. No acute intracranial abnormality. 2. Aspects is  10. 3. Mild chronic microvascular ischemic disease. Results were called by telephone at the time of interpretation on 11/04/2023 at 7:13 pm to provider DAVID [facility] , who verbally acknowledged these results. Electronically Signed   By: Morene Hoard M.D.   On: 11/04/2023 19:14     Data Reviewed: Relevant notes from primary care and specialist visits, past discharge summaries as available in EHR, including Care  Everywhere. Prior diagnostic testing as pertinent to current admission diagnoses Updated medications and problem lists for reconciliation ED course, including vitals, labs, imaging, treatment and response to treatment Triage notes, nursing and pharmacy notes and ED provider's notes Notable results as noted in HPI   Assessment and Plan: * Acute focal neurological deficit Suspect TIA MRI brain without acute infarct showing chronic small vessel ischemic changes, CTA without LVO Permissive hypertension for first 24-48 hrs post stroke onset: Prn Labetalol IV or Vasotec IV If BP greater than 220/120  Statins for LDL goal less than 70 ASA 81mg  daily, Plavix  75mg  daily x 3 weeks then monotherapy thereafter Telemetry, echo Avoid dextrose  containing fluids, Maintain euglycemia, euthermia Neuro checks q4 hrs x 24 hrs and then per shift Head of bed 30 degrees Physical therapy/Occupational therapy/Speech therapy if failed dysphagia screen Neurology consult to follow   Cervical lymphadenopathy Possible local infection not identified Continue to monitor  Prediabetes A1c 6.4 on 08/06/2023 Consistent carbohydrate diet  Localized osteoarthritis of left knee Continue Robaxin  and as needed ibuprofen   Intrinsic eczema On topical steroids by PCP  Obesity, Class III, BMI 40-49.9 (morbid obesity) (HCC) Complicating factor to overall prognosis and care     DVT prophylaxis: Lovenox   Consults: neurology  Advance Care Planning: full code  Family Communication: none  Disposition Plan: Back to previous home environment  Severity of Illness: The appropriate patient status for this patient is INPATIENT. Inpatient status is judged to be reasonable and necessary in order to provide the required intensity of service to ensure the patient's safety. The patient's presenting symptoms, physical exam findings, and initial radiographic and laboratory data in the context of their chronic comorbidities is  felt to place them at high risk for further clinical deterioration. Furthermore, it is not anticipated that the patient will be medically stable for discharge from the hospital within 2 midnights of admission.   * I certify that at the point of admission it is my clinical judgment that the patient will require inpatient hospital care spanning beyond 2 midnights from the point of admission due to high intensity of service, high risk for further deterioration and high frequency of surveillance required.*  Author: Delayne LULLA Solian, MD 11/05/2023 12:22 AM  For on call review [email or web address].

## 2023-11-05 NOTE — Progress Notes (Signed)
 PHARMACIST - PHYSICIAN COMMUNICATION  CONCERNING:  Enoxaparin  (Lovenox ) for DVT Prophylaxis    RECOMMENDATION: Patient was prescribed enoxaprin 40mg  q24 hours for VTE prophylaxis.   Filed Weights   11/04/23 1907  Weight: 129.7 kg (285 lb 14.4 oz)    Body mass index is 42.22 kg/m.  Estimated Creatinine Clearance: 112 mL/min (by C-G formula based on SCr of 0.83 mg/dL).   Based on [facility] policy patient is candidate for enoxaparin  0.5mg /kg TBW SQ every 24 hours based on BMI being >30.  DESCRIPTION: Pharmacy has adjusted enoxaparin  dose per [facility] policy.  Patient is now receiving enoxaparin  0.5 mg/kg every 24 hours   Rankin CANDIE Dills, PharmD, [facility] 11/05/2023 12:30 AM

## 2023-11-05 NOTE — Discharge Summary (Signed)
 Physician Discharge Summary   Patient: Heidi Acevedo MRN: [id number] DOB: 1971/05/29  Admit date:     11/04/2023  Discharge date: 11/05/23  Discharge Physician: Amaryllis Dare   PCP: Johanne Quinten SQUIBB, MD   Recommendations at discharge:  Please obtain CBC and BMP on follow-up Please follow-up the results of A1c-start the medications if diabetic. We started her on losartan  for persistently elevated blood pressure, please monitor and titrate. Follow-up with primary care provider within a week  Discharge Diagnoses: Principal Problem:   Acute focal neurological deficit Active Problems:   Obesity, Class III, BMI 40-49.9 (morbid obesity) (HCC)   Intrinsic eczema   Localized osteoarthritis of left knee   Prediabetes   TIA (transient ischemic attack)   Cervical lymphadenopathy   Right arm numbness   Numbness and tingling of right face   Hospital Course: Taken from H&P.   Magnolia Mattila is a 53 y.o. female with medical history significant for Osteoarthritis of the knee currently receiving physical therapy, morbid obesity, HLD, prediabetes and eczema, who presents to the ED as a code stroke after presenting with sudden onset right-sided numbness and tingling of the face, arm and leg starting at 1825 on 11/03/2022.   She was seen in consultation initially by teleneuro and subsequently by neurologist, Dr. Lindzen.  She was not a tPA candidate due to NIH of 0 and symptoms to mild to treat. Initial CT head was negative for any acute abnormality. CTA head and neck showed no LVO MRI brain revealed no acute infarct.  Mild chronic small vessel ischemic changes. Initial vitals with elevated blood pressure at 185/92 and labs with mild leukocytosis at 12, rest mostly unremarkable.  Admitted for stroke workup.  1/7; blood pressure elevated at 152/88.  Lipid panel with elevated total cholesterol at 214 and LDL at 141, UA and UDS negative.  A1c pending Started on Crestor  and losartan , does need to  be titrated by PCP PT and OT evaluation without any recommendations as patient is at baseline, all deficits has been resolved.  No stroke but TIA is a possibility or symptoms due to undiagnosed hypertension.  Echocardiogram without any abnormality. Discussed with neurology and patient is being discharged on baby aspirin , Crestor  and losartan .  Patient will continue on current medications and need to have a close follow-up with her PCP for further management.     Assessment and Plan: * Acute focal neurological deficit Suspect TIA Likely TIA.  All imaging was negative. Completed the stroke workup. May be due to undiagnosed hypertension so she was started on losartan  along with baby aspirin  and Crestor .  Cervical lymphadenopathy Possible local infection not identified Continue to monitor  Prediabetes A1c 6.4 on 08/06/2023 Consistent carbohydrate diet  Localized osteoarthritis of left knee Continue Robaxin  and as needed ibuprofen   Intrinsic eczema On topical steroids by PCP  Obesity, Class III, BMI 40-49.9 (morbid obesity) (HCC) Complicating factor to overall prognosis and care  Consultants: None Procedures performed: None Disposition: Home Diet recommendation:  Discharge Diet Orders (From admission, onward)     Start     Ordered   11/05/23 0000  Diet - low sodium heart healthy        11/05/23 1448           Cardiac and Carb modified diet DISCHARGE MEDICATION: Allergies as of 11/05/2023   No Known Allergies      Medication List     STOP taking these medications    ibuprofen  800 MG tablet Commonly known as: ADVIL   TAKE these medications    aspirin  EC 81 MG tablet Take 1 tablet (81 mg total) by mouth daily. Swallow whole. Start taking on: November 06, 2023   CENTRUM SILVER PO Take 1 tablet by mouth daily.   diclofenac Sodium 1 % Gel Commonly known as: VOLTAREN Apply 4 g topically 4 (four) times daily. To knee   lidocaine  5 % Commonly known as:  LIDODERM  Place 1 patch onto the skin daily. Remove & Discard patch within 12 hours or as directed by MD   losartan  50 MG tablet Commonly known as: COZAAR  Take 1 tablet (50 mg total) by mouth daily.   methocarbamol  500 MG tablet Commonly known as: ROBAXIN  Take 1 tablet (500 mg total) by mouth every 8 (eight) hours as needed.   rosuvastatin  20 MG tablet Commonly known as: CRESTOR  Take 1 tablet (20 mg total) by mouth daily.        Follow-up Information     Johanne Quinten SQUIBB, MD. Schedule an appointment as soon as possible for a visit in 1 week(s).   Specialty: Internal Medicine Contact information: [address] [facility] [address] [place] [address] [phone]                Discharge Exam: Fredricka Weights   11/04/23 1907  Weight: 129.7 kg   General.  Morbidly obese lady, in no acute distress. Pulmonary.  Lungs clear bilaterally, normal respiratory effort. CV.  Regular rate and rhythm, no JVD, rub or murmur. Abdomen.  Soft, nontender, nondistended, BS positive. CNS.  Alert and oriented .  No focal neurologic deficit. Extremities.  No edema, no cyanosis, pulses intact and symmetrical. Psychiatry.  Judgment and insight appears normal.   Condition at discharge: stable  The results of significant diagnostics from this hospitalization (including imaging, microbiology, ancillary and laboratory) are listed below for reference.   Imaging Studies: ECHOCARDIOGRAM COMPLETE Result Date: 11/05/2023    ECHOCARDIOGRAM REPORT   Patient Name:   Heidi Acevedo Date of Exam: 11/05/2023 Medical Rec #:  [id number]        Height:       69.0 in Accession #:    [id number]       Weight:       285.9 lb Date of Birth:  1971-10-21        BSA:          2.405 m Patient Age:    52 years         BP:           157/89 mmHg Patient Gender: F                HR:           73 bpm. Exam Location:  [facility] Procedure: 2D Echo, Cardiac Doppler and Color Doppler Indications:     Stroke  History:         Patient has no  prior history of Echocardiogram examinations.                  Stroke; Risk Factors:Hypertension and Dyslipidemia.  Sonographer:     Naomie Reef Referring Phys:  [id number] ERIC LINDZEN Diagnosing Phys: Marsa Dooms MD  Sonographer Comments: Patient is obese. IMPRESSIONS  1. Left ventricular ejection fraction, by estimation, is 55 to 60%. The left ventricle has normal function. The left ventricle has no regional wall motion abnormalities. Left ventricular diastolic parameters were normal.  2. Right ventricular systolic function is normal. The right ventricular size is normal. There  is normal pulmonary artery systolic pressure.  3. The mitral valve is normal in structure. No evidence of mitral valve regurgitation. No evidence of mitral stenosis.  4. The aortic valve is normal in structure. Aortic valve regurgitation is not visualized. No aortic stenosis is present.  5. The inferior vena cava is normal in size with greater than 50% respiratory variability, suggesting right atrial pressure of 3 mmHg. FINDINGS  Left Ventricle: Left ventricular ejection fraction, by estimation, is 55 to 60%. The left ventricle has normal function. The left ventricle has no regional wall motion abnormalities. The left ventricular internal cavity size was normal in size. There is  no left ventricular hypertrophy. Left ventricular diastolic parameters were normal. Right Ventricle: The right ventricular size is normal. No increase in right ventricular wall thickness. Right ventricular systolic function is normal. There is normal pulmonary artery systolic pressure. The tricuspid regurgitant velocity is 2.19 m/s, and  with an assumed right atrial pressure of 3 mmHg, the estimated right ventricular systolic pressure is 22.2 mmHg. Left Atrium: Left atrial size was normal in size. Right Atrium: Right atrial size was normal in size. Pericardium: There is no evidence of pericardial effusion. Mitral Valve: The mitral valve is normal in  structure. No evidence of mitral valve regurgitation. No evidence of mitral valve stenosis. MV peak gradient, 5.3 mmHg. The mean mitral valve gradient is 2.0 mmHg. Tricuspid Valve: The tricuspid valve is normal in structure. Tricuspid valve regurgitation is not demonstrated. No evidence of tricuspid stenosis. Aortic Valve: The aortic valve is normal in structure. Aortic valve regurgitation is not visualized. No aortic stenosis is present. Aortic valve mean gradient measures 3.0 mmHg. Aortic valve peak gradient measures 7.6 mmHg. Aortic valve area, by VTI measures 2.87 cm. Pulmonic Valve: The pulmonic valve was normal in structure. Pulmonic valve regurgitation is not visualized. No evidence of pulmonic stenosis. Aorta: The aortic root is normal in size and structure. Venous: The inferior vena cava is normal in size with greater than 50% respiratory variability, suggesting right atrial pressure of 3 mmHg. IAS/Shunts: No atrial level shunt detected by color flow Doppler.  LEFT VENTRICLE PLAX 2D LVIDd:         4.30 cm     Diastology LVIDs:         3.10 cm     LV e' medial:    8.59 cm/s LV PW:         1.20 cm     LV E/e' medial:  12.8 LV IVS:        1.20 cm     LV e' lateral:   9.46 cm/s LVOT diam:     2.00 cm     LV E/e' lateral: 11.6 LV SV:         80 LV SV Index:   33 LVOT Area:     3.14 cm  LV Volumes (MOD) LV vol d, MOD A2C: 44.1 ml LV vol d, MOD A4C: 71.7 ml LV vol s, MOD A2C: 17.5 ml LV vol s, MOD A4C: 27.8 ml LV SV MOD A2C:     26.6 ml LV SV MOD A4C:     71.7 ml LV SV MOD BP:      34.2 ml RIGHT VENTRICLE RV Basal diam:  3.70 cm RV Mid diam:    3.20 cm RV S prime:     10.40 cm/s LEFT ATRIUM             Index        RIGHT  ATRIUM           Index LA diam:        3.60 cm 1.50 cm/m   RA Area:     14.60 cm LA Vol (A2C):   59.2 ml 24.62 ml/m  RA Volume:   36.30 ml  15.09 ml/m LA Vol (A4C):   39.7 ml 16.51 ml/m LA Biplane Vol: 50.4 ml 20.96 ml/m  AORTIC VALVE                    PULMONIC VALVE AV Area (Vmax):     2.48 cm     PV Vmax:       0.96 m/s AV Area (Vmean):   2.60 cm     PV Peak grad:  3.7 mmHg AV Area (VTI):     2.87 cm AV Vmax:           138.00 cm/s AV Vmean:          82.400 cm/s AV VTI:            0.278 m AV Peak Grad:      7.6 mmHg AV Mean Grad:      3.0 mmHg LVOT Vmax:         109.00 cm/s LVOT Vmean:        68.200 cm/s LVOT VTI:          0.254 m LVOT/AV VTI ratio: 0.91  AORTA Ao Root diam: 2.80 cm MITRAL VALVE                TRICUSPID VALVE MV Area (PHT): 4.12 cm     TR Peak grad:   19.2 mmHg MV Area VTI:   2.19 cm     TR Vmax:        219.00 cm/s MV Peak grad:  5.3 mmHg MV Mean grad:  2.0 mmHg     SHUNTS MV Vmax:       1.15 m/s     Systemic VTI:  0.25 m MV Vmean:      69.3 cm/s    Systemic Diam: 2.00 cm MV Decel Time: 184 msec MV E velocity: 110.00 cm/s MV A velocity: 105.00 cm/s MV E/A ratio:  1.05 Marsa Dooms MD Electronically signed by Marsa Dooms MD Signature Date/Time: 11/05/2023/1:49:12 PM    Final    MR BRAIN WO CONTRAST Result Date: 11/04/2023 CLINICAL DATA:  Initial evaluation for acute neuro deficit, stroke suspected, right-sided numbness. EXAM: MRI HEAD WITHOUT CONTRAST TECHNIQUE: Multiplanar, multiecho pulse sequences of the brain and surrounding structures were obtained without intravenous contrast. COMPARISON:  Prior CTs from earlier the same day. FINDINGS: Brain: Cerebral volume within normal limits. Patchy T2/FLAIR hyperintensity seen involving the deep and subcortical white matter of both cerebral hemispheres, nonspecific, but most commonly related to chronic microvascular ischemic disease. Changes are moderate in nature. No evidence for acute or subacute ischemia. Gray-white matter differentiation maintained. No areas of chronic cortical infarction. No acute or chronic intracranial blood products. No mass lesion, midline shift or mass effect. No hydrocephalus or extra-axial fluid collection. Empty sella noted. Suprasellar region normal. Vascular: Major intracranial vascular  flow voids are maintained. Skull and upper cervical spine: Craniocervical junction normal. Bone marrow signal intensity overall within normal limits. No scalp soft tissue abnormality. Sinuses/Orbits: Globes and orbital soft tissues within normal limits. Paranasal sinuses are clear. No significant mastoid effusion. Other: None. IMPRESSION: 1. No acute intracranial infarct or other abnormality. 2. Moderate cerebral white matter disease, nonspecific, but most commonly  related to chronic microvascular ischemic disease. Electronically Signed   By: Morene Hoard M.D.   On: 11/04/2023 22:57   CT ANGIO HEAD NECK W WO CM (CODE STROKE) Result Date: 11/04/2023 CLINICAL DATA:  Initial evaluation for acute neuro deficit, stroke suspected. Right-sided deficits. EXAM: CT ANGIOGRAPHY HEAD AND NECK WITH AND WITHOUT CONTRAST TECHNIQUE: Multidetector CT imaging of the head and neck was performed using the standard protocol during bolus administration of intravenous contrast. Multiplanar CT image reconstructions and MIPs were obtained to evaluate the vascular anatomy. Carotid stenosis measurements (when applicable) are obtained utilizing NASCET criteria, using the distal internal carotid diameter as the denominator. RADIATION DOSE REDUCTION: This exam was performed according to the departmental dose-optimization program which includes automated exposure control, adjustment of the mA and/or kV according to patient size and/or use of iterative reconstruction technique. CONTRAST:  OMNIPAQUE  IOHEXOL  350 MG/ML SOLN COMPARISON:  CT from earlier the same day. FINDINGS: CTA NECK FINDINGS Aortic arch: Standard branching. Imaged portion shows no evidence of aneurysm or dissection. No significant stenosis of the major arch vessel origins. Right carotid system: No evidence of dissection, stenosis (50% or greater), or occlusion. Left carotid system: No evidence of dissection, stenosis (50% or greater), or occlusion. Vertebral  arteries: Both vertebral arteries arise from subclavian arteries. Proximal aspects of the vertebral arteries not well visualized due to adjacent venous contamination. Visualized portions of the vertebral arteries patent without stenosis or dissection. Skeleton: No discrete or worrisome osseous lesions. Other neck: No other acute finding. Upper chest: No other acute finding. Review of the MIP images confirms the above findings CTA HEAD FINDINGS Anterior circulation: Mild atheromatous change about the carotid siphons without significant stenosis. A1 segments, anterior communicating artery complex common anterior cerebral arteries patent without stenosis. No M1 stenosis or occlusion. No proximal MCA branch occlusion or high-grade stenosis. Distal MCA branches perfused and symmetric. Posterior circulation: Both V4 segments patent without significant stenosis. Both PICA patent at their origins. Basilar patent without stenosis. Superior cerebellar and posterior cerebral arteries patent bilaterally. Venous sinuses: Grossly patent allowing for timing the contrast bolus. Anatomic variants: None significant.  No aneurysm. Review of the MIP images confirms the above findings IMPRESSION: 1. Negative CTA of the head and neck. No large vessel occlusion or other emergent finding. 2. Mild atheromatous change about the carotid siphons without significant stenosis. Electronically Signed   By: Morene Hoard M.D.   On: 11/04/2023 21:39   CT HEAD CODE STROKE WO CONTRAST Result Date: 11/04/2023 CLINICAL DATA:  Code stroke. Initial evaluation for acute neuro deficit, stroke suspected, right-sided numbness. EXAM: CT HEAD WITHOUT CONTRAST TECHNIQUE: Contiguous axial images were obtained from the base of the skull through the vertex without intravenous contrast. RADIATION DOSE REDUCTION: This exam was performed according to the departmental dose-optimization program which includes automated exposure control, adjustment of the mA  and/or kV according to patient size and/or use of iterative reconstruction technique. COMPARISON:  None Available. FINDINGS: Brain: Cerebral volume within normal limits. Scattered patchy hypodensity involving the supratentorial cerebral white matter, likely related to chronic microvascular ischemic disease, mild in nature. No acute intracranial hemorrhage. No acute large vessel territory infarct. No mass lesion or midline shift. No hydrocephalus or extra-axial fluid collection. Empty sella noted. Vascular: No abnormal hyperdense vessel. Skull: Scalp soft tissues within normal limits.  Calvarium intact. Sinuses/Orbits: Globes and orbital soft tissues within normal limits. Paranasal sinuses are in clear. No mastoid effusion. Other: None. ASPECTS [facility] Stroke Program Early CT Score) - Ganglionic level  infarction (caudate, lentiform nuclei, internal capsule, insula, M1-M3 cortex): 7 - Supraganglionic infarction (M4-M6 cortex): 3 Total score (0-10 with 10 being normal): 10 IMPRESSION: 1. No acute intracranial abnormality. 2. Aspects is 10. 3. Mild chronic microvascular ischemic disease. Results were called by telephone at the time of interpretation on 11/04/2023 at 7:13 pm to provider DAVID [facility] , who verbally acknowledged these results. Electronically Signed   By: Morene Hoard M.D.   On: 11/04/2023 19:14   DG Thoracic Spine 2 View Result Date: 10/19/2023 CLINICAL DATA:  Right back pain EXAM: THORACIC SPINE 2 VIEWS COMPARISON:  None Available. FINDINGS: There is no evidence of thoracic spine fracture. Alignment is normal. No other significant bone abnormalities are identified. IMPRESSION: Negative. Electronically Signed   By: Franky Crease M.D.   On: 10/19/2023 23:52    Microbiology: Results for orders placed or performed during the hospital encounter of 01/24/17  Chlamydia/NGC rt PCR     Status: None   Collection Time: 01/24/17  4:23 AM   Specimen: Cervical/Vaginal swab  Result Value Ref Range  Status   Specimen source GC/Chlam ENDOCERVICAL  Final   Chlamydia Tr NOT DETECTED NOT DETECTED Final   N gonorrhoeae NOT DETECTED NOT DETECTED Final    Comment: (NOTE) 100  This methodology has not been evaluated in pregnant women or in 200  patients with a history of hysterectomy. 300 400  This methodology will not be performed on patients less than 18  years of age.   Wet prep, genital     Status: Abnormal   Collection Time: 01/24/17  4:23 AM   Specimen: Cervical/Vaginal swab; Genital  Result Value Ref Range Status   Yeast Wet Prep HPF POC NONE SEEN NONE SEEN Final   Trich, Wet Prep NONE SEEN NONE SEEN Final   Clue Cells Wet Prep HPF POC PRESENT (A) NONE SEEN Final   WBC, Wet Prep HPF POC FEW (A) NONE SEEN Final   Sperm NONE SEEN  Final    Labs: CBC: Recent Labs  Lab 11/04/23 1850  WBC 12.1*  NEUTROABS 7.7  HGB 11.9*  HCT 37.0  MCV 88.1  PLT 386   Basic Metabolic Panel: Recent Labs  Lab 11/04/23 1850  NA 138  K 4.0  CL 103  CO2 25  GLUCOSE 110*  BUN 11  CREATININE 0.83  CALCIUM  8.7*  MG 2.1   Liver Function Tests: Recent Labs  Lab 11/04/23 1850  AST 19  ALT 20  ALKPHOS 91  BILITOT 0.6  PROT 7.8  ALBUMIN 3.8   CBG: Recent Labs  Lab 11/04/23 1906  GLUCAP 104*    Discharge time spent: greater than 30 minutes.  This record has been created using Conservation officer, historic buildings. Errors have been sought and corrected,but may not always be located. Such creation errors do not reflect on the standard of care.   Signed: Amaryllis Dare, MD [facility] 11/05/2023

## 2023-11-05 NOTE — Assessment & Plan Note (Signed)
 On topical steroids by PCP

## 2023-11-05 NOTE — Assessment & Plan Note (Signed)
 Possible local infection not identified Continue to monitor

## 2023-11-05 NOTE — Hospital Course (Signed)
 Heidi Acevedo

## 2023-11-05 NOTE — Progress Notes (Signed)
 OT Cancellation Note  Patient Details Name: Heidi Acevedo MRN: [id number] DOB: 1971/08/12   Cancelled Treatment:    Reason Eval/Treat Not Completed: OT screened, no needs identified, will sign off. Order received, chart reviewed. Per conversation with PT, pt is back to baseline functional independence. No skilled OT needs identified. Will sign off. Please re-consult if additional needs arise.   Elston Slot, M.S. OTR/L  11/05/23, 11:20 AM  ascom [phone]

## 2023-11-05 NOTE — Progress Notes (Signed)
*  PRELIMINARY RESULTS* Echocardiogram 2D Echocardiogram has been performed.  Carolyne Fiscal 11/05/2023, 12:53 PM

## 2023-11-05 NOTE — Assessment & Plan Note (Signed)
 A1c 6.4 on 08/06/2023 Consistent carbohydrate diet

## 2023-11-05 NOTE — Assessment & Plan Note (Signed)
 Complicating factor to overall prognosis and care

## 2023-11-05 NOTE — Progress Notes (Signed)
 SLP Cancellation Note  Patient Details Name: Heidi Acevedo MRN: [id number] DOB: [birth year]   Cancelled treatment:       Reason Eval/Treat Not Completed: SLP screened, no needs identified, will sign off (chart reviewed; consulted MD and met w/ pt in room as she was receiving her lunch tray.)   Pt denied any difficulty swallowing and is currently on a regular diet; tolerates swallowing pills w/ water per NSG. Pt endorsed ease of drinking at breakfast meal w/out any deficits.  Pt conversed in conversation w/out expressive/receptive deficits noted; pt denied any speech-language deficits. Speech clear. Pt conversed about her prediabetes situation and now following a better diet.  No further skilled ST services indicated as pt appears at her baseline. Pt agreed. NSG to reconsult if any change in status while admitted.     Comer Portugal, MS, CCC-SLP Speech Language Pathologist Rehab Services; [facility] [phone] (ascom) Khristi Schiller 11/05/2023, 11:53 AM

## 2023-11-06 LAB — HEMOGLOBIN A1C
Hgb A1c MFr Bld: 6.5 % — ABNORMAL HIGH (ref 4.8–5.6)
Mean Plasma Glucose: 140 mg/dL
